# Patient Record
Sex: Female | Born: 1980 | Race: Black or African American | Hispanic: No | Marital: Single | Smoking: Never smoker
Health system: Southern US, Community
[De-identification: ages and names within clinical notes are randomized; demographics above are authoritative.]

## PROBLEM LIST (undated history)

## (undated) DIAGNOSIS — K219 Gastro-esophageal reflux disease without esophagitis: Secondary | ICD-10-CM

## (undated) DIAGNOSIS — R011 Cardiac murmur, unspecified: Secondary | ICD-10-CM

## (undated) DIAGNOSIS — N809 Endometriosis, unspecified: Secondary | ICD-10-CM

## (undated) DIAGNOSIS — J189 Pneumonia, unspecified organism: Secondary | ICD-10-CM

## (undated) DIAGNOSIS — R519 Headache, unspecified: Secondary | ICD-10-CM

## (undated) DIAGNOSIS — Z789 Other specified health status: Secondary | ICD-10-CM

## (undated) DIAGNOSIS — F419 Anxiety disorder, unspecified: Secondary | ICD-10-CM

## (undated) DIAGNOSIS — D649 Anemia, unspecified: Secondary | ICD-10-CM

## (undated) DIAGNOSIS — J40 Bronchitis, not specified as acute or chronic: Secondary | ICD-10-CM

## (undated) DIAGNOSIS — IMO0001 Reserved for inherently not codable concepts without codable children: Secondary | ICD-10-CM

## (undated) HISTORY — PX: OOPHORECTOMY: SHX86

---
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
## 2002-03-25 ENCOUNTER — Other Ambulatory Visit: Admission: RE | Admit: 2002-03-25 | Discharge: 2002-03-25 | Payer: Self-pay | Admitting: Obstetrics and Gynecology

## 2002-05-04 ENCOUNTER — Ambulatory Visit (HOSPITAL_COMMUNITY): Admission: RE | Admit: 2002-05-04 | Discharge: 2002-05-04 | Payer: Self-pay | Admitting: Obstetrics and Gynecology

## 2002-05-04 ENCOUNTER — Inpatient Hospital Stay (HOSPITAL_COMMUNITY): Admission: AD | Admit: 2002-05-04 | Discharge: 2002-05-04 | Payer: Self-pay | Admitting: *Deleted

## 2002-05-21 ENCOUNTER — Inpatient Hospital Stay (HOSPITAL_COMMUNITY): Admission: AD | Admit: 2002-05-21 | Discharge: 2002-05-21 | Payer: Self-pay | Admitting: Obstetrics and Gynecology

## 2002-05-21 ENCOUNTER — Encounter: Payer: Self-pay | Admitting: Obstetrics and Gynecology

## 2002-05-23 ENCOUNTER — Inpatient Hospital Stay (HOSPITAL_COMMUNITY): Admission: AD | Admit: 2002-05-23 | Discharge: 2002-05-26 | Payer: Self-pay | Admitting: Obstetrics and Gynecology

## 2002-09-23 ENCOUNTER — Emergency Department (HOSPITAL_COMMUNITY): Admission: EM | Admit: 2002-09-23 | Discharge: 2002-09-23 | Payer: Self-pay | Admitting: Emergency Medicine

## 2002-09-23 ENCOUNTER — Encounter: Payer: Self-pay | Admitting: Emergency Medicine

## 2002-10-01 ENCOUNTER — Emergency Department (HOSPITAL_COMMUNITY): Admission: EM | Admit: 2002-10-01 | Discharge: 2002-10-01 | Payer: Self-pay | Admitting: Emergency Medicine

## 2002-11-04 HISTORY — PX: CERVIX LESION DESTRUCTION: SHX591

## 2003-04-06 ENCOUNTER — Other Ambulatory Visit: Admission: RE | Admit: 2003-04-06 | Discharge: 2003-04-06 | Payer: Self-pay | Admitting: Obstetrics and Gynecology

## 2003-06-02 ENCOUNTER — Inpatient Hospital Stay (HOSPITAL_COMMUNITY): Admission: AD | Admit: 2003-06-02 | Discharge: 2003-06-02 | Payer: Self-pay | Admitting: Obstetrics and Gynecology

## 2003-07-08 ENCOUNTER — Inpatient Hospital Stay (HOSPITAL_COMMUNITY): Admission: AD | Admit: 2003-07-08 | Discharge: 2003-07-08 | Payer: Self-pay | Admitting: Obstetrics and Gynecology

## 2003-08-10 ENCOUNTER — Encounter: Admission: RE | Admit: 2003-08-10 | Discharge: 2003-09-07 | Payer: Self-pay | Admitting: Family Medicine

## 2003-09-28 ENCOUNTER — Other Ambulatory Visit: Admission: RE | Admit: 2003-09-28 | Discharge: 2003-09-28 | Payer: Self-pay | Admitting: Obstetrics and Gynecology

## 2004-02-08 ENCOUNTER — Inpatient Hospital Stay (HOSPITAL_COMMUNITY): Admission: AD | Admit: 2004-02-08 | Discharge: 2004-02-08 | Payer: Self-pay | Admitting: Obstetrics and Gynecology

## 2004-06-15 ENCOUNTER — Inpatient Hospital Stay (HOSPITAL_COMMUNITY): Admission: AD | Admit: 2004-06-15 | Discharge: 2004-06-15 | Payer: Self-pay | Admitting: Obstetrics and Gynecology

## 2004-10-01 ENCOUNTER — Emergency Department (HOSPITAL_COMMUNITY): Admission: EM | Admit: 2004-10-01 | Discharge: 2004-10-01 | Payer: Self-pay | Admitting: Emergency Medicine

## 2004-10-08 ENCOUNTER — Other Ambulatory Visit: Admission: RE | Admit: 2004-10-08 | Discharge: 2004-10-08 | Payer: Self-pay | Admitting: Obstetrics and Gynecology

## 2005-07-26 ENCOUNTER — Inpatient Hospital Stay (HOSPITAL_COMMUNITY): Admission: AD | Admit: 2005-07-26 | Discharge: 2005-07-27 | Payer: Self-pay | Admitting: Obstetrics and Gynecology

## 2005-07-27 ENCOUNTER — Inpatient Hospital Stay (HOSPITAL_COMMUNITY): Admission: AD | Admit: 2005-07-27 | Discharge: 2005-07-27 | Payer: Self-pay | Admitting: Obstetrics and Gynecology

## 2005-12-03 ENCOUNTER — Emergency Department (HOSPITAL_COMMUNITY): Admission: EM | Admit: 2005-12-03 | Discharge: 2005-12-03 | Payer: Self-pay | Admitting: Emergency Medicine

## 2006-02-18 ENCOUNTER — Other Ambulatory Visit: Admission: RE | Admit: 2006-02-18 | Discharge: 2006-02-18 | Payer: Self-pay | Admitting: Obstetrics and Gynecology

## 2006-07-07 ENCOUNTER — Inpatient Hospital Stay (HOSPITAL_COMMUNITY): Admission: AD | Admit: 2006-07-07 | Discharge: 2006-07-07 | Payer: Self-pay | Admitting: Obstetrics and Gynecology

## 2006-09-05 ENCOUNTER — Inpatient Hospital Stay (HOSPITAL_COMMUNITY): Admission: AD | Admit: 2006-09-05 | Discharge: 2006-09-05 | Payer: Self-pay | Admitting: Obstetrics and Gynecology

## 2006-10-01 ENCOUNTER — Emergency Department (HOSPITAL_COMMUNITY): Admission: EM | Admit: 2006-10-01 | Discharge: 2006-10-01 | Payer: Self-pay | Admitting: Emergency Medicine

## 2006-10-03 ENCOUNTER — Inpatient Hospital Stay (HOSPITAL_COMMUNITY): Admission: AD | Admit: 2006-10-03 | Discharge: 2006-10-03 | Payer: Self-pay | Admitting: Obstetrics & Gynecology

## 2006-10-08 ENCOUNTER — Emergency Department (HOSPITAL_COMMUNITY): Admission: EM | Admit: 2006-10-08 | Discharge: 2006-10-08 | Payer: Self-pay | Admitting: Emergency Medicine

## 2006-11-22 ENCOUNTER — Inpatient Hospital Stay (HOSPITAL_COMMUNITY): Admission: AD | Admit: 2006-11-22 | Discharge: 2006-11-22 | Payer: Self-pay | Admitting: Obstetrics and Gynecology

## 2006-12-15 ENCOUNTER — Inpatient Hospital Stay (HOSPITAL_COMMUNITY): Admission: AD | Admit: 2006-12-15 | Discharge: 2006-12-15 | Payer: Self-pay | Admitting: Obstetrics and Gynecology

## 2007-01-27 ENCOUNTER — Inpatient Hospital Stay (HOSPITAL_COMMUNITY): Admission: AD | Admit: 2007-01-27 | Discharge: 2007-01-31 | Payer: Self-pay | Admitting: Obstetrics & Gynecology

## 2007-04-02 ENCOUNTER — Inpatient Hospital Stay (HOSPITAL_COMMUNITY): Admission: AD | Admit: 2007-04-02 | Discharge: 2007-04-02 | Payer: Self-pay | Admitting: Obstetrics and Gynecology

## 2007-11-06 ENCOUNTER — Inpatient Hospital Stay (HOSPITAL_COMMUNITY): Admission: AD | Admit: 2007-11-06 | Discharge: 2007-11-06 | Payer: Self-pay | Admitting: Obstetrics and Gynecology

## 2008-01-30 ENCOUNTER — Emergency Department (HOSPITAL_COMMUNITY): Admission: EM | Admit: 2008-01-30 | Discharge: 2008-01-31 | Payer: Self-pay | Admitting: Emergency Medicine

## 2008-11-04 HISTORY — PX: OOPHORECTOMY: SHX86

## 2008-11-04 HISTORY — PX: OTHER SURGICAL HISTORY: SHX169

## 2008-11-20 ENCOUNTER — Inpatient Hospital Stay (HOSPITAL_COMMUNITY): Admission: AD | Admit: 2008-11-20 | Discharge: 2008-11-20 | Payer: Self-pay | Admitting: Obstetrics and Gynecology

## 2009-06-05 ENCOUNTER — Emergency Department (HOSPITAL_BASED_OUTPATIENT_CLINIC_OR_DEPARTMENT_OTHER): Admission: EM | Admit: 2009-06-05 | Discharge: 2009-06-06 | Payer: Self-pay | Admitting: Emergency Medicine

## 2009-06-06 ENCOUNTER — Ambulatory Visit: Payer: Self-pay | Admitting: Diagnostic Radiology

## 2009-07-20 ENCOUNTER — Encounter (INDEPENDENT_AMBULATORY_CARE_PROVIDER_SITE_OTHER): Payer: Self-pay | Admitting: Obstetrics and Gynecology

## 2009-07-20 ENCOUNTER — Ambulatory Visit (HOSPITAL_COMMUNITY): Admission: RE | Admit: 2009-07-20 | Discharge: 2009-07-20 | Payer: Self-pay | Admitting: Obstetrics and Gynecology

## 2009-10-01 ENCOUNTER — Ambulatory Visit: Payer: Self-pay | Admitting: Obstetrics and Gynecology

## 2009-10-01 ENCOUNTER — Inpatient Hospital Stay (HOSPITAL_COMMUNITY): Admission: AD | Admit: 2009-10-01 | Discharge: 2009-10-01 | Payer: Self-pay | Admitting: Obstetrics and Gynecology

## 2009-11-17 ENCOUNTER — Emergency Department (HOSPITAL_BASED_OUTPATIENT_CLINIC_OR_DEPARTMENT_OTHER): Admission: EM | Admit: 2009-11-17 | Discharge: 2009-11-17 | Payer: Self-pay | Admitting: Emergency Medicine

## 2010-01-10 ENCOUNTER — Emergency Department (HOSPITAL_BASED_OUTPATIENT_CLINIC_OR_DEPARTMENT_OTHER): Admission: EM | Admit: 2010-01-10 | Discharge: 2010-01-10 | Payer: Self-pay | Admitting: Emergency Medicine

## 2010-04-26 ENCOUNTER — Emergency Department (HOSPITAL_BASED_OUTPATIENT_CLINIC_OR_DEPARTMENT_OTHER): Admission: EM | Admit: 2010-04-26 | Discharge: 2010-04-26 | Payer: Self-pay | Admitting: Emergency Medicine

## 2010-05-09 ENCOUNTER — Emergency Department (HOSPITAL_COMMUNITY): Admission: EM | Admit: 2010-05-09 | Discharge: 2010-05-09 | Payer: Self-pay | Admitting: Emergency Medicine

## 2010-06-28 ENCOUNTER — Emergency Department (HOSPITAL_BASED_OUTPATIENT_CLINIC_OR_DEPARTMENT_OTHER): Admission: EM | Admit: 2010-06-28 | Discharge: 2010-06-29 | Payer: Self-pay | Admitting: Emergency Medicine

## 2010-11-25 ENCOUNTER — Encounter: Payer: Self-pay | Admitting: Family Medicine

## 2011-01-18 LAB — URINALYSIS, ROUTINE W REFLEX MICROSCOPIC
Ketones, ur: NEGATIVE mg/dL
Nitrite: NEGATIVE
Protein, ur: NEGATIVE mg/dL
pH: 8.5 — ABNORMAL HIGH (ref 5.0–8.0)

## 2011-01-18 LAB — PREGNANCY, URINE: Preg Test, Ur: NEGATIVE

## 2011-01-20 LAB — URINALYSIS, ROUTINE W REFLEX MICROSCOPIC
Bilirubin Urine: NEGATIVE
Hgb urine dipstick: NEGATIVE
Protein, ur: NEGATIVE mg/dL
Urobilinogen, UA: 0.2 mg/dL (ref 0.0–1.0)

## 2011-01-20 LAB — POCT I-STAT, CHEM 8
BUN: 9 mg/dL (ref 6–23)
Calcium, Ion: 1.12 mmol/L (ref 1.12–1.32)
Creatinine, Ser: 0.8 mg/dL (ref 0.4–1.2)
Glucose, Bld: 90 mg/dL (ref 70–99)
TCO2: 25 mmol/L (ref 0–100)

## 2011-02-06 LAB — WET PREP, GENITAL

## 2011-02-06 LAB — URINALYSIS, ROUTINE W REFLEX MICROSCOPIC
Glucose, UA: NEGATIVE mg/dL
Protein, ur: NEGATIVE mg/dL
Specific Gravity, Urine: 1.015 (ref 1.005–1.030)
pH: 8.5 — ABNORMAL HIGH (ref 5.0–8.0)

## 2011-02-06 LAB — POCT PREGNANCY, URINE: Preg Test, Ur: NEGATIVE

## 2011-02-08 LAB — CBC
HCT: 33.8 % — ABNORMAL LOW (ref 36.0–46.0)
MCV: 96.6 fL (ref 78.0–100.0)
Platelets: 271 10*3/uL (ref 150–400)
WBC: 4.5 10*3/uL (ref 4.0–10.5)

## 2011-02-10 LAB — CBC
HCT: 37.5 % (ref 36.0–46.0)
Hemoglobin: 12.9 g/dL (ref 12.0–15.0)
MCHC: 34.4 g/dL (ref 30.0–36.0)
MCV: 96.4 fL (ref 78.0–100.0)
RBC: 3.89 MIL/uL (ref 3.87–5.11)

## 2011-02-10 LAB — DIFFERENTIAL
Basophils Relative: 1 % (ref 0–1)
Eosinophils Absolute: 0.2 10*3/uL (ref 0.0–0.7)
Monocytes Absolute: 0.4 10*3/uL (ref 0.1–1.0)
Monocytes Relative: 5 % (ref 3–12)

## 2011-02-10 LAB — GC/CHLAMYDIA PROBE AMP, GENITAL
Chlamydia, DNA Probe: NEGATIVE
GC Probe Amp, Genital: NEGATIVE

## 2011-02-10 LAB — URINALYSIS, ROUTINE W REFLEX MICROSCOPIC
Glucose, UA: NEGATIVE mg/dL
Hgb urine dipstick: NEGATIVE
Protein, ur: NEGATIVE mg/dL
Specific Gravity, Urine: 1.023 (ref 1.005–1.030)

## 2011-02-10 LAB — BASIC METABOLIC PANEL
CO2: 29 mEq/L (ref 19–32)
Chloride: 103 mEq/L (ref 96–112)
GFR calc Af Amer: >60 mL/min (ref >60)
Potassium: 3.6 mEq/L (ref 3.5–5.1)
Sodium: 143 mEq/L (ref 135–145)

## 2011-02-18 LAB — DIFFERENTIAL
Basophils Absolute: 0 10*3/uL (ref 0.0–0.1)
Eosinophils Relative: 0 % (ref 0–5)
Lymphocytes Relative: 4 % — ABNORMAL LOW (ref 12–46)
Monocytes Absolute: 0.2 10*3/uL (ref 0.1–1.0)

## 2011-02-18 LAB — CBC
MCV: 97.1 fL (ref 78.0–100.0)
Platelets: 276 10*3/uL (ref 150–400)
RDW: 11.3 % — ABNORMAL LOW (ref 11.5–15.5)
WBC: 12.8 10*3/uL — ABNORMAL HIGH (ref 4.0–10.5)

## 2011-02-18 LAB — URINALYSIS, ROUTINE W REFLEX MICROSCOPIC
Bilirubin Urine: NEGATIVE
Nitrite: NEGATIVE
Protein, ur: NEGATIVE mg/dL
Specific Gravity, Urine: 1.015 (ref 1.005–1.030)
Urobilinogen, UA: 0.2 mg/dL (ref 0.0–1.0)

## 2011-02-18 LAB — WET PREP, GENITAL: Trich, Wet Prep: NONE SEEN

## 2011-02-18 LAB — POCT PREGNANCY, URINE: Preg Test, Ur: NEGATIVE

## 2011-03-22 NOTE — Op Note (Signed)
[facility]  Patient:    Monique Hughes, Monique Hughes Visit Number: [id number] MRN: [id number]          Service Type: GYN Location: [facility] Attending Physician:  Donne Hazel Dictated by:   Marcelle Overlie, M.D. Proc. Date: 05/04/02 Admit Date:  05/04/2002 Discharge Date: 05/04/2002                             Operative Report  PREOPERATIVE DIAGNOSIS:       Pelvic pain.  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  POSTOPERATIVE DIAGNOSIS:      Endometriosis.  OPERATION:                    Laparoscopy and fulguration of endometriosis.  SURGEON:                      Marcelle Overlie, M.D.  ASSISTANT:  ANESTHESIA:                   General.  ESTIMATED BLOOD LOSS:         Less than 50 cc.  DESCRIPTION OF PROCEDURE:     The patient was taken to the operating room after she was intubated.  She was then placed in the lithotomy position. The abdomen, vagina, and vulva were prepped and draped in the usual sterile fashion.  An in-and-out catheter was used to empty the bladder.  The uterine manipulator was inserted into the uterus.  Attention was then turned to the abdomen where a small incision was made at the umbilicus and the Veress needle was inserted into the peritoneal cavity.  A pneumoperitoneum was then achieved. The Veress needle was removed and a 10 mm trocar was inserted without difficulty.  The patient was placed in Trendelenburg position.  The laparoscope was introduced into the peritoneal cavity.  There was no bleeding noted.  Two accessory trocars were inserted.  A 5 mm suprapubic and a 5 mm left lower quadrant.  These were both inserted under direct visualization. The upper abdomen was inspected and noted to be normal.  The pelvis was inspected.  The uterus appeared to be normal.  The left fallopian tube and left ovary appeared normal.  The pelvis, however, revealed powderburn lesions in the cul-de-sac and one thin filmy adhesion which was  consistent with endometriosis.  A careful inspection of the right ovary, the surface appeared very smooth.  There was no evidence on the exterior surface of a dermoid, however, a longitudinal incision was made along the entire course of the ovary and the ovary was explored.  There was nothing inside that appeared consistent with a dermoid tumor.  The ovary was hemostatic with Kleppingers and the endometriosis in the pelvis was fulgurated using Kleppingers.  All of the serous fluid in the cul-de-sac was also irrigated as well.  All areas were noted to be hemostatic.  The pneumoperitoneum was released.  All instruments were removed from the abdominal cavity and incisions were closed using Dermabond.  All sponge, needle, and instrument counts were correct x2.  The patient tolerated the procedure well and went to the recovery room in stable condition. Dictated by:   Marcelle Overlie, M.D. Attending Physician:  Donne Hazel DD:  05/04/02 TD:  05/06/02 Job: [id number]

## 2011-03-22 NOTE — Discharge Summary (Signed)
   NAME:  Monique Hughes, Monique Hughes                         ACCOUNT NO.:  [id number]   MEDICAL RECORD NO.:  [id number]                   PATIENT TYPE:  MAT   LOCATION:  [facility]                                 FACILITY:  [facility]   PHYSICIAN:  Michelle L. Vincente Poli, M.D.            DATE OF BIRTH:  01/29/1981   DATE OF ADMISSION:  05/22/2002  DATE OF DISCHARGE:  05/26/2002                                 DISCHARGE SUMMARY   ADMISSION DIAGNOSES:  Endometritis.   DISCHARGE DIAGNOSES:  Endometritis.   HOSPITAL COURSE:  An 30 year old gravida 0 who recently underwent a  laparoscopy for treatment of endometriosis.  Presents complaining of lower  abdominal pain, fever, nausea, and vomiting.  On admission she was noted to  be afebrile; however, had an elevated white count of 18.6 with a left shift.  She had some right lower quadrant tenderness.  Pelvic ultrasound was  performed which was unremarkable.  The patient was placed on antibiotics and  observed.  Her white blood cell count decreased and was within normal range  of 8.9.  Her hemoglobin stayed stable 10.8.  The patient was discharged home  on May 26, 2002 without pain.  She had remained afebrile with stable vital  signs throughout her hospitalization.  She was discharged home with  Augmentin 250 mg p.o. b.i.d. for 14 days.  She was advised to call if she  has a temperature greater than 100.5, nausea, vomiting, severe abdominal  pain.  She will follow up in the office in two weeks.                                               Michelle L. Vincente Poli, M.D.    Florestine Avers  D:  06/16/2002  T:  06/17/2002  Job:  [id number]

## 2011-06-01 ENCOUNTER — Encounter: Payer: Self-pay | Admitting: Emergency Medicine

## 2011-06-01 ENCOUNTER — Emergency Department (HOSPITAL_BASED_OUTPATIENT_CLINIC_OR_DEPARTMENT_OTHER)
Admission: EM | Admit: 2011-06-01 | Discharge: 2011-06-01 | Disposition: A | Discharge status: Home / Self Care | Payer: Self-pay | Attending: Emergency Medicine | Admitting: Emergency Medicine

## 2011-06-01 DIAGNOSIS — IMO0001 Reserved for inherently not codable concepts without codable children: Secondary | ICD-10-CM | POA: Insufficient documentation

## 2011-06-01 DIAGNOSIS — S40861A Insect bite (nonvenomous) of right upper arm, initial encounter: Secondary | ICD-10-CM

## 2011-06-01 DIAGNOSIS — J45909 Unspecified asthma, uncomplicated: Secondary | ICD-10-CM | POA: Insufficient documentation

## 2011-06-01 MED ORDER — DIPHENHYDRAMINE HCL 50 MG/ML IJ SOLN
25.0000 mg | Freq: Once | INTRAMUSCULAR | Status: AC
  Administered 2011-06-01: 25 mg via INTRAMUSCULAR
  Filled 2011-06-01: qty 1

## 2011-06-01 NOTE — ED Notes (Signed)
Pt states she was stung by a bee or wasp earlier today.  Noted redness at right ac area.  No resp distress.  Pt c/o pain at site.

## 2011-06-01 NOTE — ED Provider Notes (Addendum)
History     Chief Complaint  Patient presents with  . Insect Bite   HPI Comments: Pt was stung by a bee and states that she has redness and she state that she has a previous history of her foot swelling from a similar sting  Patient is a 30 y.o. female presenting with animal bite. The history is provided by the patient. No language interpreter was used.  Animal Bite  The incident occurred just prior to arrival. The incident occurred at home. She came to the ER via personal transport. There is an injury to the right upper arm. The pain is mild. Pertinent negatives include no chest pain, no numbness, no visual disturbance, no nausea, no vomiting, no hearing loss, no cough and no difficulty breathing. There have been no prior injuries to these areas. There were no sick contacts.    Past Medical History  Diagnosis Date  . Asthma     Past Surgical History  Procedure Date  . Oophorectomy     History reviewed. No pertinent family history.  History  Substance Use Topics  . Smoking status: Never Smoker   . Smokeless tobacco: Not on file  . Alcohol Use: No    OB History    Grav Para Term Preterm Abortions TAB SAB Ect Mult Living                  Review of Systems  HENT: Negative for hearing loss.   Eyes: Negative for visual disturbance.  Respiratory: Negative for cough.   Cardiovascular: Negative for chest pain.  Gastrointestinal: Negative for nausea and vomiting.  Neurological: Negative for numbness.  All other systems reviewed and are negative.    Physical Exam  BP 111/76  Pulse 98  Temp(Src) 98.5 F (36.9 C) (Oral)  Resp 16  SpO2 100%  LMP 05/02/2011  Physical Exam  Nursing note and vitals reviewed. Constitutional: She appears well-developed and well-nourished.  Neck: Normal range of motion. Neck supple.  Cardiovascular: Normal rate and regular rhythm.   Pulmonary/Chest: Effort normal and breath sounds normal.  Musculoskeletal: Normal range of motion.    Neurological: She is alert.  Skin:       ED Course  Procedures  MDM No swelling noted:pt not in any distress:pt given benadryl and requesting to leave   Medical screening examination/treatment/procedure(s) were performed by non-physician practitioner and as supervising physician I was immediately available for consultation/collaboration.   Teressa Lower, NP 06/01/11 1555  Teressa Lower, NP 06/01/11 1555  Juliet Rude. Rubin Payor, MD 06/01/11 [phone]

## 2011-06-03 ENCOUNTER — Emergency Department (HOSPITAL_BASED_OUTPATIENT_CLINIC_OR_DEPARTMENT_OTHER)
Admission: EM | Admit: 2011-06-03 | Discharge: 2011-06-03 | Disposition: A | Discharge status: Home / Self Care | Payer: Self-pay | Attending: Emergency Medicine | Admitting: Emergency Medicine

## 2011-06-03 ENCOUNTER — Encounter (HOSPITAL_BASED_OUTPATIENT_CLINIC_OR_DEPARTMENT_OTHER): Payer: Self-pay | Admitting: *Deleted

## 2011-06-03 DIAGNOSIS — L0390 Cellulitis, unspecified: Secondary | ICD-10-CM

## 2011-06-03 DIAGNOSIS — T63461A Toxic effect of venom of wasps, accidental (unintentional), initial encounter: Secondary | ICD-10-CM | POA: Insufficient documentation

## 2011-06-03 DIAGNOSIS — IMO0002 Reserved for concepts with insufficient information to code with codable children: Secondary | ICD-10-CM | POA: Insufficient documentation

## 2011-06-03 DIAGNOSIS — T6391XA Toxic effect of contact with unspecified venomous animal, accidental (unintentional), initial encounter: Secondary | ICD-10-CM | POA: Insufficient documentation

## 2011-06-03 MED ORDER — SULFAMETHOXAZOLE-TMP DS 800-160 MG PO TABS
1.0000 | ORAL_TABLET | Freq: Once | ORAL | Status: AC
  Administered 2011-06-03: 1 via ORAL
  Filled 2011-06-03: qty 1

## 2011-06-03 MED ORDER — SULFAMETHOXAZOLE-TRIMETHOPRIM 800-160 MG PO TABS: 1.0000 | ORAL_TABLET | Freq: Two times a day (BID) | ORAL | Status: AC

## 2011-06-03 NOTE — ED Notes (Signed)
Pt seen here x 3 days ago for bee sting, return today for same, increased swelling redness.

## 2011-06-03 NOTE — ED Provider Notes (Addendum)
History     Chief Complaint  Patient presents with  . Insect Bite   Patient is a 30 y.o. female presenting with arm injury. The history is provided by the patient.  Arm Injury  The incident occurred more than 2 days ago. The incident occurred in the street. There is an injury to the right upper arm. The pain is moderate. It is unlikely that a foreign body is present. There have been prior injuries to these areas. Her tetanus status is UTD. There were no sick contacts. She has received no recent medical care.  Pt was stung by a bee 3 days ago.  Pt reports she has been taking benadryl.  Pt reports area has been increasing in redness and swelling.  Past Medical History  Diagnosis Date  . Asthma     Past Surgical History  Procedure Date  . Oophorectomy     History reviewed. No pertinent family history.  History  Substance Use Topics  . Smoking status: Never Smoker   . Smokeless tobacco: Not on file  . Alcohol Use: No    OB History    Grav Para Term Preterm Abortions TAB SAB Ect Mult Living                  Review of Systems  Skin: Positive for color change.  All other systems reviewed and are negative.    Physical Exam  BP 108/65  Pulse 88  Temp(Src) 98.8 F (37.1 C) (Oral)  Resp 16  Wt 130 lb (58.968 kg)  SpO2 100%  LMP 05/02/2011  Physical Exam  Constitutional: She appears well-developed and well-nourished.  HENT:  Head: Normocephalic.  Musculoskeletal: She exhibits edema and tenderness.  Neurological: She is alert.  Skin: There is erythema.  Psychiatric: She has a normal mood and affect.  12 cm red swollen area right inner arm,  Warm to touch,  From nv and ns intact  ED Course  Procedures  MDM Pt feels sick,  I will treat with bactrim pt advised to continue benadryl every 4hours.  Medical screening examination/treatment/procedure(s) were performed by non-physician practitioner and as supervising physician I was immediately available for  consultation/collaboration. Osvaldo Human, M.D.    Langston Masker, [place] 06/03/11 2247  Langston Masker, [place] 06/03/11 2248  Carleene Cooper III, MD 06/04/11 1121  Carleene Cooper III, MD 07/12/11 [phone]

## 2011-06-03 NOTE — Discharge Instructions (Signed)
Skin Infections A skin infection usually develop as a result of disruption of the skin barrier.  CAUSES A skin infection might occur following:  Trauma or an injury to the skin such as a cut or insect sting.   Inflammation (eczema).   Breaks in the skin between the toes (athletes foot).   Edema (swelling).  SYMPTOMS The legs are the most common site affected. Usually there is:  Redness.   Swelling.   Pain.   Sometimes red streaks in the area of the infection.  TREATMENT Minor skin infections may be treated with topical antibiotics, but if the skin infection is severe, hospital care and intravenous (IV) antibiotic treatment may be needed. Most often skin infections can be treated with oral antibiotic medicine as well as proper rest and elevation of the affected area until the infection improves. If you are prescribed oral antibiotics, it is important to take them as directed and to take all the pills regardless of improvement in your symptoms before you have taken all of the medicine. You may apply warm compresses to the area for 20-30 minutes 4 times daily.  SEEK MEDICAL CARE IF:  The pain and swelling from your infection do not improve within 2 days.  SEEK IMMEDIATE MEDICAL CARE IF:  You develop a fever, chills, or other serious problems.  YOU MIGHT NEED A TETANUS SHOT NOW IF:  You have no idea when you had the last one.   You have never had a tetanus shot before.   Your wound had dirt in it.   If you need a tetanus shot, and you choose not to get one, there is a rare chance of getting tetanus. Sickness from tetanus can be serious.  Document Released: 11/28/2004 Document Re-Released: 01/17/2009 [facility] Patient Information 2011 [place], [place].

## 2011-07-24 ENCOUNTER — Encounter (HOSPITAL_BASED_OUTPATIENT_CLINIC_OR_DEPARTMENT_OTHER): Payer: Self-pay | Admitting: *Deleted

## 2011-07-24 ENCOUNTER — Emergency Department (HOSPITAL_BASED_OUTPATIENT_CLINIC_OR_DEPARTMENT_OTHER)
Admission: EM | Admit: 2011-07-24 | Discharge: 2011-07-24 | Disposition: A | Discharge status: Home / Self Care | Payer: Self-pay | Attending: Emergency Medicine | Admitting: Emergency Medicine

## 2011-07-24 ENCOUNTER — Emergency Department (INDEPENDENT_AMBULATORY_CARE_PROVIDER_SITE_OTHER): Payer: Self-pay

## 2011-07-24 DIAGNOSIS — R0602 Shortness of breath: Secondary | ICD-10-CM | POA: Insufficient documentation

## 2011-07-24 DIAGNOSIS — Z79899 Other long term (current) drug therapy: Secondary | ICD-10-CM | POA: Insufficient documentation

## 2011-07-24 DIAGNOSIS — J45909 Unspecified asthma, uncomplicated: Secondary | ICD-10-CM | POA: Insufficient documentation

## 2011-07-24 DIAGNOSIS — R062 Wheezing: Secondary | ICD-10-CM

## 2011-07-24 LAB — URINALYSIS, ROUTINE W REFLEX MICROSCOPIC
Glucose, UA: NEGATIVE
Nitrite: NEGATIVE
Protein, ur: NEGATIVE
Urobilinogen, UA: 0.2

## 2011-07-24 LAB — COMPREHENSIVE METABOLIC PANEL
ALT: 53 — ABNORMAL HIGH
AST: 24
Calcium: 9.2
Creatinine, Ser: 0.66
GFR calc Af Amer: >60
Sodium: 138
Total Protein: 6.6

## 2011-07-24 LAB — DIFFERENTIAL
Eosinophils Absolute: 0
Eosinophils Relative: 0
Lymphocytes Relative: 24
Lymphs Abs: 1.7
Monocytes Relative: 4

## 2011-07-24 LAB — CBC
MCHC: 34.8
Platelets: 322
RBC: 3.57 — ABNORMAL LOW
RDW: 12

## 2011-07-24 LAB — POCT PREGNANCY, URINE: Preg Test, Ur: NEGATIVE

## 2011-07-24 MED ORDER — ALBUTEROL SULFATE (5 MG/ML) 0.5% IN NEBU
5.0000 mg | INHALATION_SOLUTION | Freq: Once | RESPIRATORY_TRACT | Status: AC
  Administered 2011-07-24: 5 mg via RESPIRATORY_TRACT
  Filled 2011-07-24: qty 1

## 2011-07-24 MED ORDER — PREDNISONE 10 MG PO TABS: 40.0000 mg | ORAL_TABLET | Freq: Every day | ORAL | Status: AC

## 2011-07-24 NOTE — ED Provider Notes (Signed)
History     CSN: [id number] Arrival date & time: 07/24/2011 12:56 PM   Chief Complaint  Patient presents with  . Shortness of Breath  . Asthma     (Include location/radiation/quality/duration/timing/severity/associated sxs/prior treatment) Patient is a 30 y.o. female presenting with shortness of breath and asthma. The history is provided by the patient. No language interpreter was used.  Shortness of Breath  The current episode started yesterday. The onset was sudden. The problem has been unchanged. The problem is mild. The symptoms are relieved by nothing. Associated symptoms include cough and shortness of breath. Pertinent negatives include no fever. Her past medical history is significant for asthma.  Asthma Associated symptoms include coughing. Pertinent negatives include no fever.     Past Medical History  Diagnosis Date  . Asthma      Past Surgical History  Procedure Date  . Oophorectomy     History reviewed. No pertinent family history.  History  Substance Use Topics  . Smoking status: Never Smoker   . Smokeless tobacco: Not on file  . Alcohol Use: No    OB History    Grav Para Term Preterm Abortions TAB SAB Ect Mult Living                  Review of Systems  Constitutional: Negative for fever.  Respiratory: Positive for cough and shortness of breath.   All other systems reviewed and are negative.    Allergies  Chocolate; Citric acid; Grapefruit extract; Lemon juice; Orange fruit; and Tomato  Home Medications   Current Outpatient Rx  Name Route Sig Dispense Refill  . ACETAMINOPHEN 500 MG PO TABS Oral Take 1,000 mg by mouth as needed. For headache    . DIPHENHYDRAMINE HCL 25 MG PO TABS Oral Take 25 mg by mouth as needed. Allergic reaction    . FLUCONAZOLE 150 MG PO TABS Oral Take 150 mg by mouth once.      . MOMETASONE FURO-FORMOTEROL FUM 100-5 MCG/ACT IN AERO Inhalation Inhale 2 puffs into the lungs 2 (two) times daily as needed. For wheezing,  shortness of breath, or coughing     . MULTI-VITAMIN/MINERALS PO TABS Oral Take 1 tablet by mouth daily.      . LO LOESTRIN FE PO Oral Take 1 tablet by mouth daily.      Marland Kitchen NORGESTREL-ETHINYL ESTRADIOL 0.3-30 MG-MCG PO TABS Oral Take 1 tablet by mouth daily.      Marland Kitchen FLORA-Q PO Oral Take 1 tablet by mouth daily.      Marland Kitchen PROMETHAZINE HCL 25 MG PO TABS Oral Take 12.5 mg by mouth as needed. Nausea and vomiting     . VISINE OP Both Eyes Place 2 drops into both eyes daily as needed. For itchy eyes       Physical Exam    BP 106/74  Pulse 101  Temp(Src) 99.2 F (37.3 C) (Oral)  Resp 21  Wt 130 lb (58.968 kg)  SpO2 100%  LMP 07/04/2011  Physical Exam  Nursing note and vitals reviewed. Constitutional: She is oriented to person, place, and time. She appears well-developed.  HENT:  Head: Atraumatic.  Right Ear: External ear normal.  Left Ear: External ear normal.  Mouth/Throat: Oropharynx is clear and moist.  Eyes: Pupils are equal, round, and reactive to light.  Cardiovascular: Normal rate and regular rhythm.   Pulmonary/Chest: Effort normal and breath sounds normal.  Musculoskeletal: Normal range of motion.  Neurological: She is alert and oriented to person, place, and  time.  Skin: Skin is warm and dry.    ED Course  Procedures   07/24/2011  *RADIOLOGY REPORT*  Clinical Data: Wheezing and short of breath  CHEST - 2 VIEW  Comparison: 05/09/2010  Findings: Normal heart size.  Lungs are hyperaerated and clear. Mild bronchitic changes.  No pneumothorax or pleural fluid.  IMPRESSION: No active cardiopulmonary disease.  Original Report Authenticated By: Donavan Burnet, M.D.     No diagnosis found.   MDM Pt feeling better after treatment:will put on steroids for bronchitis       Teressa Lower, NP 07/24/11 1433

## 2011-07-24 NOTE — ED Notes (Signed)
Pt c/o hx asthma, non pro cough, SOb x 1 week

## 2011-07-28 NOTE — ED Provider Notes (Signed)
Medical screening examination/treatment/procedure(s) were conducted as a shared visit with non-physician practitioner(s) and myself.  I personally evaluated the patient during the encounter  Toy Baker, MD 07/28/11 1547

## 2012-05-24 ENCOUNTER — Emergency Department (HOSPITAL_BASED_OUTPATIENT_CLINIC_OR_DEPARTMENT_OTHER)
Admission: EM | Admit: 2012-05-24 | Discharge: 2012-05-24 | Disposition: A | Discharge status: Home / Self Care | Payer: No Typology Code available for payment source | Attending: Emergency Medicine | Admitting: Emergency Medicine

## 2012-05-24 ENCOUNTER — Encounter (HOSPITAL_BASED_OUTPATIENT_CLINIC_OR_DEPARTMENT_OTHER): Payer: Self-pay | Admitting: *Deleted

## 2012-05-24 ENCOUNTER — Emergency Department (HOSPITAL_BASED_OUTPATIENT_CLINIC_OR_DEPARTMENT_OTHER): Payer: No Typology Code available for payment source

## 2012-05-24 DIAGNOSIS — S43409A Unspecified sprain of unspecified shoulder joint, initial encounter: Secondary | ICD-10-CM

## 2012-05-24 DIAGNOSIS — Y9301 Activity, walking, marching and hiking: Secondary | ICD-10-CM | POA: Insufficient documentation

## 2012-05-24 DIAGNOSIS — K219 Gastro-esophageal reflux disease without esophagitis: Secondary | ICD-10-CM | POA: Insufficient documentation

## 2012-05-24 DIAGNOSIS — IMO0002 Reserved for concepts with insufficient information to code with codable children: Secondary | ICD-10-CM | POA: Insufficient documentation

## 2012-05-24 DIAGNOSIS — Y998 Other external cause status: Secondary | ICD-10-CM | POA: Insufficient documentation

## 2012-05-24 DIAGNOSIS — W010XXA Fall on same level from slipping, tripping and stumbling without subsequent striking against object, initial encounter: Secondary | ICD-10-CM | POA: Insufficient documentation

## 2012-05-24 HISTORY — DX: Gastro-esophageal reflux disease without esophagitis: K21.9

## 2012-05-24 HISTORY — DX: Reserved for inherently not codable concepts without codable children: IMO0001

## 2012-05-24 MED ORDER — HYDROCODONE-ACETAMINOPHEN 5-500 MG PO TABS: 1.0000 | ORAL_TABLET | Freq: Four times a day (QID) | ORAL | Status: AC | PRN

## 2012-05-24 MED ORDER — CYCLOBENZAPRINE HCL 10 MG PO TABS: 10.0000 mg | ORAL_TABLET | Freq: Two times a day (BID) | ORAL | Status: AC | PRN

## 2012-05-24 MED ORDER — KETOROLAC TROMETHAMINE 60 MG/2ML IM SOLN
60.0000 mg | Freq: Once | INTRAMUSCULAR | Status: AC
  Administered 2012-05-24: 60 mg via INTRAMUSCULAR
  Filled 2012-05-24: qty 2

## 2012-05-24 NOTE — ED Notes (Signed)
Patient states she slipped on water at [facility] yesterday around [phone].  Landed on her right side and shoulder.  C/O pain right shoulder, upper back and right foot pain. Denies loc.

## 2012-05-24 NOTE — ED Provider Notes (Addendum)
History     CSN: [id number]  Arrival date & time 05/24/12  1004   First MD Initiated Contact with Patient 05/24/12 1028      Chief Complaint  Patient presents with  . Fall  . Shoulder Injury    (Consider location/radiation/quality/duration/timing/severity/associated sxs/prior treatment) Patient is a 31 y.o. female presenting with fall. The history is provided by the patient.  Fall The accident occurred yesterday. The fall occurred while walking (Slipped in water and fell on a concrete floor). She fell from a height of 1 to 2 ft. She landed on concrete. There was no blood loss. The point of impact was the right shoulder. The pain is present in the right shoulder. The pain is at a severity of 9/10. The pain is severe. She was ambulatory at the scene. Pertinent negatives include no numbness, no abdominal pain, no bowel incontinence, no headaches, no loss of consciousness and no tingling. The symptoms are aggravated by activity, use of the injured limb, pressure on the injury and rotation. She has tried NSAIDs and acetaminophen for the symptoms. The treatment provided mild relief.    Past Medical History  Diagnosis Date  . Asthma   . Reflux     Past Surgical History  Procedure Date  . Oophorectomy   . Laproscopy 2010    cyst removal from ovary    No family history on file.  History  Substance Use Topics  . Smoking status: Never Smoker   . Smokeless tobacco: Not on file  . Alcohol Use: No    OB History    Grav Para Term Preterm Abortions TAB SAB Ect Mult Living                  Review of Systems  Gastrointestinal: Negative for abdominal pain and bowel incontinence.  Neurological: Negative for tingling, loss of consciousness, numbness and headaches.  All other systems reviewed and are negative.    Allergies  Chocolate; Citric acid; Grapefruit extract; Lemon juice; Orange fruit; and Tomato  Home Medications   Current Outpatient Rx  Name Route Sig Dispense Refill   . ACETAMINOPHEN 500 MG PO TABS Oral Take 1,000 mg by mouth as needed. For headache    . DIPHENHYDRAMINE HCL 25 MG PO TABS Oral Take 25 mg by mouth as needed. Allergic reaction    . FLUCONAZOLE 150 MG PO TABS Oral Take 150 mg by mouth once.      . MOMETASONE FURO-FORMOTEROL FUM 100-5 MCG/ACT IN AERO Inhalation Inhale 2 puffs into the lungs 2 (two) times daily as needed. For wheezing, shortness of breath, or coughing     . MULTI-VITAMIN/MINERALS PO TABS Oral Take 1 tablet by mouth daily.      . LO LOESTRIN FE PO Oral Take 1 tablet by mouth daily.      Marland Kitchen NORGESTREL-ETHINYL ESTRADIOL 0.3-30 MG-MCG PO TABS Oral Take 1 tablet by mouth daily.      Marland Kitchen FLORA-Q PO Oral Take 1 tablet by mouth daily.      Marland Kitchen PROMETHAZINE HCL 25 MG PO TABS Oral Take 12.5 mg by mouth as needed. Nausea and vomiting     . VISINE OP Both Eyes Place 2 drops into both eyes daily as needed. For itchy eyes       BP 104/71  Pulse 78  Temp 98.3 F (36.8 C) (Oral)  Resp 20  SpO2 100%  LMP 05/09/2012  Physical Exam  Nursing note and vitals reviewed. Constitutional: She is oriented to person, place,  and time. She appears well-developed and well-nourished. She appears distressed.  HENT:  Head: Normocephalic and atraumatic.  Eyes: EOM are normal. Pupils are equal, round, and reactive to light.  Cardiovascular: Normal rate, regular rhythm, normal heart sounds and intact distal pulses.  Exam reveals no friction rub.   No murmur heard. Pulmonary/Chest: Effort normal and breath sounds normal. She has no wheezes. She has no rales. She exhibits no tenderness.  Abdominal: Soft. Bowel sounds are normal. She exhibits no distension. There is no tenderness. There is no rebound and no guarding.  Musculoskeletal: She exhibits no tenderness.       Right shoulder: She exhibits decreased range of motion, tenderness, bony tenderness, pain and spasm. She exhibits no swelling, no deformity, normal pulse and normal strength.       Right wrist:  Normal.       Right foot: Normal.       No edema  Neurological: She is alert and oriented to person, place, and time. No cranial nerve deficit.  Skin: Skin is warm and dry. No rash noted.  Psychiatric: She has a normal mood and affect. Her behavior is normal.    ED Course  Procedures (including critical care time)  Labs Reviewed - No data to display Dg Shoulder Right  05/24/2012  *RADIOLOGY REPORT*  Clinical Data: Fall, left shoulder pain, decreased range of motion  RIGHT SHOULDER - 2+ VIEW  Comparison: None.  Findings: Normal alignment without fracture.  AC joint aligned. Intact left ribs and scapula.  IMPRESSION: No acute osseous finding  Original Report Authenticated By: Judie Petit. Ruel Favors, M.D.     1. Shoulder sprain       MDM   Patient with a mechanical fall last night resulting in injury to the right shoulder. She states initially her right wrist and right foot are hurting which has resolved. However the shoulder pain persists. She denies any head injury or LOC.  Significant pain over the anterior and posterior shoulder. No clavicular pain. Neurovascularly intact decreased range of motion due to pain. Plain films pending.  11:22 AM Plain films neg.  Pt given pain control and d/ced home.    Gwyneth Sprout, MD 05/24/12 1136  Gwyneth Sprout, MD 05/24/12 1138

## 2012-07-08 ENCOUNTER — Inpatient Hospital Stay (HOSPITAL_COMMUNITY): Payer: Self-pay

## 2012-07-08 ENCOUNTER — Encounter (HOSPITAL_COMMUNITY): Payer: Self-pay | Admitting: *Deleted

## 2012-07-08 ENCOUNTER — Inpatient Hospital Stay (HOSPITAL_COMMUNITY)
Admission: AD | Admit: 2012-07-08 | Discharge: 2012-07-09 | Disposition: A | Discharge status: Home / Self Care | Payer: Self-pay | Source: Ambulatory Visit | Attending: Obstetrics and Gynecology | Admitting: Obstetrics and Gynecology

## 2012-07-08 DIAGNOSIS — Z3490 Encounter for supervision of normal pregnancy, unspecified, unspecified trimester: Secondary | ICD-10-CM

## 2012-07-08 DIAGNOSIS — O34599 Maternal care for other abnormalities of gravid uterus, unspecified trimester: Secondary | ICD-10-CM | POA: Insufficient documentation

## 2012-07-08 DIAGNOSIS — R109 Unspecified abdominal pain: Secondary | ICD-10-CM | POA: Insufficient documentation

## 2012-07-08 DIAGNOSIS — N949 Unspecified condition associated with female genital organs and menstrual cycle: Secondary | ICD-10-CM | POA: Insufficient documentation

## 2012-07-08 DIAGNOSIS — R1032 Left lower quadrant pain: Secondary | ICD-10-CM

## 2012-07-08 DIAGNOSIS — N831 Corpus luteum cyst of ovary, unspecified side: Secondary | ICD-10-CM | POA: Insufficient documentation

## 2012-07-08 HISTORY — DX: Other specified health status: Z78.9

## 2012-07-08 LAB — URINALYSIS, ROUTINE W REFLEX MICROSCOPIC
Glucose, UA: NEGATIVE mg/dL
Hgb urine dipstick: NEGATIVE
Ketones, ur: NEGATIVE mg/dL
Leukocytes, UA: NEGATIVE
Protein, ur: NEGATIVE mg/dL
pH: 8.5 — ABNORMAL HIGH (ref 5.0–8.0)

## 2012-07-08 LAB — CBC WITH DIFFERENTIAL/PLATELET
Basophils Absolute: 0 10*3/uL (ref 0.0–0.1)
Eosinophils Absolute: 0 10*3/uL (ref 0.0–0.7)
Eosinophils Relative: 0 % (ref 0–5)
HCT: 34.2 % — ABNORMAL LOW (ref 36.0–46.0)
Lymphocytes Relative: 18 % (ref 12–46)
MCH: 31.7 pg (ref 26.0–34.0)
MCHC: 34.2 g/dL (ref 30.0–36.0)
MCV: 92.7 fL (ref 78.0–100.0)
Monocytes Absolute: 0.5 10*3/uL (ref 0.1–1.0)
Platelets: 311 10*3/uL (ref 150–400)
RDW: 11.8 % (ref 11.5–15.5)
WBC: 10.1 10*3/uL (ref 4.0–10.5)

## 2012-07-08 LAB — POCT PREGNANCY, URINE: Preg Test, Ur: POSITIVE — AB

## 2012-07-08 LAB — WET PREP, GENITAL: Trich, Wet Prep: NONE SEEN

## 2012-07-08 MED ORDER — KETOROLAC TROMETHAMINE 60 MG/2ML IM SOLN
60.0000 mg | Freq: Once | INTRAMUSCULAR | Status: DC
  Filled 2012-07-08: qty 2

## 2012-07-08 NOTE — MAU Provider Note (Signed)
History     CSN: [id number]  Arrival date and time: 07/08/12 2045   First Provider Initiated Contact with Patient 07/08/12 2209      Chief Complaint  Patient presents with  . Chills  . Abdominal Pain   HPI Monique Hughes is a 31 y.o. female who presents to MAU with abdominal pain. The pain started 4 or 5 days ago. She describes the pain sharp shooting that comes and goes. The pain is located in the left lower abdomen. She rates the pain as 8/10. The pain is better with lying down and being still. Moving and lifting seems to increase the pain. Taking tylenol and gets some relief. Also took Advil. Associated symptoms include low grade fever, chills, nausea. Current sex partner x 7 years. Last pap smear less than one year and was normal. No history of STI's. Hx of ovarian cysts and had ovary removed on the right due to cyst. The history was provided by the patient.   OB History    Grav Para Term Preterm Abortions TAB SAB Ect Mult Living   4 1 1  0 3 1 2  0 0 1      Past Medical History  Diagnosis Date  . Asthma   . Reflux   . No pertinent past medical history     Past Surgical History  Procedure Date  . Oophorectomy   . Laproscopy 2010    cyst removal from ovary    Family History  Problem Relation Age of Onset  . Diabetes Mother   . Heart disease Father     History  Substance Use Topics  . Smoking status: Never Smoker   . Smokeless tobacco: Not on file  . Alcohol Use: No    Allergies:  Allergies  Allergen Reactions  . Chocolate Hives  . Citric Acid Hives  . Grapefruit Extract Hives  . Lemon Juice Hives  . Orange Fruit Hives  . Sulfa Antibiotics Nausea And Vomiting  . Tomato Hives    Prescriptions prior to admission  Medication Sig Dispense Refill  . acetaminophen (TYLENOL) 500 MG tablet Take 1,000 mg by mouth as needed. For headache, side pain      . diphenhydrAMINE (BENADRYL) 25 MG tablet Take 25 mg by mouth as needed. Allergic reaction      . fluconazole  (DIFLUCAN) 150 MG tablet Take 150 mg by mouth once.        . mometasone-formoterol (DULERA) 100-5 MCG/ACT AERO Inhale 2 puffs into the lungs 2 (two) times daily as needed. For wheezing, shortness of breath, or coughing       . Multiple Vitamins-Minerals (MULTIVITAMIN WITH MINERALS) tablet Take 1 tablet by mouth daily.        . Probiotic Product (FLORA-Q PO) Take 1 tablet by mouth daily.        . promethazine (PHENERGAN) 25 MG tablet Take 12.5 mg by mouth as needed. Nausea and vomiting       . Tetrahydrozoline HCl (VISINE OP) Place 2 drops into both eyes daily as needed. For itchy eyes       . DISCONTD: Norethin-Eth Estrad-Fe Biphas (LO LOESTRIN FE PO) Take 1 tablet by mouth daily.        Marland Kitchen DISCONTD: norgestrel-ethinyl estradiol (LO/OVRAL,CRYSELLE) 0.3-30 MG-MCG tablet Take 1 tablet by mouth daily.          Review of Systems  Constitutional: Positive for fever and chills. Negative for weight loss.  HENT: Negative for ear pain, nosebleeds, congestion, sore throat  and neck pain.   Eyes: Negative for blurred vision, double vision, photophobia and pain.  Respiratory: Negative for cough, shortness of breath and wheezing.   Cardiovascular: Negative for chest pain, palpitations and leg swelling.  Gastrointestinal: Positive for nausea and abdominal pain. Negative for heartburn, vomiting, diarrhea and constipation.  Genitourinary: Positive for frequency. Negative for dysuria and urgency.  Musculoskeletal: Negative for myalgias and back pain.  Skin: Negative for itching and rash.  Neurological: Positive for dizziness. Negative for sensory change, speech change, seizures, weakness and headaches.  Endo/Heme/Allergies: Does not bruise/bleed easily.  Psychiatric/Behavioral: Negative for depression. The patient is nervous/anxious.    Physical Exam   Blood pressure 111/81, pulse 118, temperature 98.3 F (36.8 C), resp. rate 16, height 5\' 4"  (1.626 m), weight 140 lb 9.6 oz (63.776 kg), last menstrual period  05/30/2012.  Physical Exam  Nursing note and vitals reviewed. Constitutional: She is oriented to person, place, and time. She appears well-developed and well-nourished. No distress.  HENT:  Head: Normocephalic and atraumatic.  Eyes: EOM are normal.  Neck: Neck supple.  Cardiovascular: Normal rate.   Respiratory: Effort normal.  GI: Soft. There is tenderness in the left lower quadrant. There is no rigidity, no rebound, no guarding and no CVA tenderness.  Genitourinary:       External genitalia without lesions. White discharge vaginal vault. Cervix inflamed. Positive CMT, left adnexal tenderness. Uterus slightly enlarged.  Musculoskeletal: Normal range of motion.  Neurological: She is alert and oriented to person, place, and time.  Skin: Skin is warm and dry.  Psychiatric: She has a normal mood and affect. Her behavior is normal. Judgment and thought content normal.   Results for orders placed during the hospital encounter of 07/08/12 (from the past 24 hour(s))  URINALYSIS, ROUTINE W REFLEX MICROSCOPIC     Status: Abnormal   Collection Time   07/08/12  9:05 PM      Component Value Range   Color, Urine YELLOW  YELLOW   APPearance CLEAR  CLEAR   Specific Gravity, Urine 1.010  1.005 - 1.030   pH 8.5 (*) 5.0 - 8.0   Glucose, UA NEGATIVE  NEGATIVE mg/dL   Hgb urine dipstick NEGATIVE  NEGATIVE   Bilirubin Urine NEGATIVE  NEGATIVE   Ketones, ur NEGATIVE  NEGATIVE mg/dL   Protein, ur NEGATIVE  NEGATIVE mg/dL   Urobilinogen, UA 0.2  0.0 - 1.0 mg/dL   Nitrite NEGATIVE  NEGATIVE   Leukocytes, UA NEGATIVE  NEGATIVE  POCT PREGNANCY, URINE     Status: Abnormal   Collection Time   07/08/12  9:23 PM      Component Value Range   Preg Test, Ur POSITIVE (*) NEGATIVE  CBC WITH DIFFERENTIAL     Status: Abnormal   Collection Time   07/08/12 10:20 PM      Component Value Range   WBC 10.1  4.0 - 10.5 K/uL   RBC 3.69 (*) 3.87 - 5.11 MIL/uL   Hemoglobin 11.7 (*) 12.0 - 15.0 g/dL   HCT 09.8 (*) 11.9 -  46.0 %   MCV 92.7  78.0 - 100.0 fL   MCH 31.7  26.0 - 34.0 pg   MCHC 34.2  30.0 - 36.0 g/dL   RDW 14.7  82.9 - 56.2 %   Platelets 311  150 - 400 K/uL   Neutrophils Relative 77  43 - 77 %   Neutro Abs 7.8 (*) 1.7 - 7.7 K/uL   Lymphocytes Relative 18  12 - 46 %  Lymphs Abs 1.8  0.7 - 4.0 K/uL   Monocytes Relative 5  3 - 12 %   Monocytes Absolute 0.5  0.1 - 1.0 K/uL   Eosinophils Relative 0  0 - 5 %   Eosinophils Absolute 0.0  0.0 - 0.7 K/uL   Basophils Relative 0  0 - 1 %   Basophils Absolute 0.0  0.0 - 0.1 K/uL  WET PREP, GENITAL     Status: Abnormal   Collection Time   07/08/12 11:00 PM      Component Value Range   Yeast Wet Prep HPF POC NONE SEEN  NONE SEEN   Trich, Wet Prep NONE SEEN  NONE SEEN   Clue Cells Wet Prep HPF POC NONE SEEN  NONE SEEN   WBC, Wet Prep HPF POC MODERATE (*) NONE SEEN   Procedures US Ob Comp Less 14 Wks  07/09/2012  *RADIOLOGY REPORT*  Clinical Data: Pelvic and left lower quadrant pain, positive urine pregnancy test; no quantitative beta HCG for correlation.  EGA [redacted] weeks 4 days by LMP  OBSTETRIC <14 WK [place] AND TRANSVAGINAL OB US  Technique:  Both transabdominal and transvaginal ultrasound examinations were performed for complete evaluation of the gestation as well as the maternal uterus, adnexal regions, and pelvic cul-de-sac.  Transvaginal technique was performed to assess early pregnancy.  Comparison:  None for this pregnancy  Intrauterine gestational sac:  Visualized/normal in shape. Yolk sac: Present Embryo: Not identified Cardiac Activity: N/A Heart Rate: N/A bpm  MSD: 9.7 mm     5 w   5 d     [place] EDC: 03/05/2013  Maternal uterus/adnexae: Tiny amount of endometrial fluid. No definite subchorionic hemorrhage. Left ovary measures 4.2 x 2.1 x 2.5 cm in size and contains a small corpus luteum 1.5 x 1.2 x 1.2 cm. Right ovary surgically absent by history. No free pelvic fluid or adnexal masses.  IMPRESSION: Intrauterine gestational sac with mean sac diameter  corresponding to an estimated gestational age of [redacted] weeks 5 days, congruent with LMP. A yolk sac is visualized but no fetal pole is identified to establish fetal viability. Consider follow-up imaging in 10 days to establish fetal viability. No other sonographic abnormalities identified.   Original Report Authenticated By: Lollie Marrow, M.D.    US Ob Transvaginal  07/09/2012  *RADIOLOGY REPORT*  Clinical Data: Pelvic and left lower quadrant pain, positive urine pregnancy test; no quantitative beta HCG for correlation.  EGA [redacted] weeks 4 days by LMP  OBSTETRIC <14 WK [place] AND TRANSVAGINAL OB US  Technique:  Both transabdominal and transvaginal ultrasound examinations were performed for complete evaluation of the gestation as well as the maternal uterus, adnexal regions, and pelvic cul-de-sac.  Transvaginal technique was performed to assess early pregnancy.  Comparison:  None for this pregnancy  Intrauterine gestational sac:  Visualized/normal in shape. Yolk sac: Present Embryo: Not identified Cardiac Activity: N/A Heart Rate: N/A bpm  MSD: 9.7 mm     5 w   5 d     [place] EDC: 03/05/2013  Maternal uterus/adnexae: Tiny amount of endometrial fluid. No definite subchorionic hemorrhage. Left ovary measures 4.2 x 2.1 x 2.5 cm in size and contains a small corpus luteum 1.5 x 1.2 x 1.2 cm. Right ovary surgically absent by history. No free pelvic fluid or adnexal masses.  IMPRESSION: Intrauterine gestational sac with mean sac diameter corresponding to an estimated gestational age of [redacted] weeks 5 days, congruent with LMP. A yolk sac is visualized but no  fetal pole is identified to establish fetal viability. Consider follow-up imaging in 10 days to establish fetal viability. No other sonographic abnormalities identified.   Original Report Authenticated By: Lollie Marrow, M.D.    Assessment: 31 y.o. female @ 5 weeks and 4 days gestation with pelvic pain   Left CLC  Plan:  Discussed with Dr. Arelia Sneddon and will have patient follow up in the  office in one week   Tylenol as needed for discomfort  Discussed with the patient and all questioned fully answered. She will follow up in the office or return here if any problems arise. Medication List  As of 07/09/2012 12:33 AM   CONTINUE taking these medications         acetaminophen 500 MG tablet   Commonly known as: TYLENOL      diphenhydrAMINE 25 MG tablet   Commonly known as: BENADRYL      DULERA 100-5 MCG/ACT Aero   Generic drug: mometasone-formoterol      FLORA-Q PO      multivitamin with minerals tablet      promethazine 25 MG tablet   Commonly known as: PHENERGAN      VISINE OP         STOP taking these medications         fluconazole 150 MG tablet      LO LOESTRIN FE PO      norgestrel-ethinyl estradiol 0.3-30 MG-MCG tablet           Follow-up Information    Call Juluis Mire, MD. (follow up in one week)    Contact information:   [facility] [address] [place] [address] [phone]         Follow-up Information    Call Juluis Mire, MD. (follow up in one week)    Contact information:   [facility] [address], [address] [place] [address] [phone]          Kerrie Buffalo, RN, FNP, [facility] 07/08/2012, 10:09 PM

## 2012-07-08 NOTE — MAU Note (Signed)
Pt LMP 05/30/2012, hx irreg periods, LLQ pain x 1 week, chills x 2 days.  Pt had right ovary removed 07/2009 due to cyst.

## 2012-07-08 NOTE — MAU Note (Signed)
Pt states she is having right side sharp pain, irregular periods that have been irregular since January.Pt states periods are coming "either the first week in the month or the last week in the month.

## 2012-07-09 LAB — GC/CHLAMYDIA PROBE AMP, GENITAL: GC Probe Amp, Genital: NEGATIVE

## 2013-02-12 ENCOUNTER — Other Ambulatory Visit: Payer: Self-pay | Admitting: Obstetrics and Gynecology

## 2014-03-23 ENCOUNTER — Other Ambulatory Visit: Payer: Self-pay | Admitting: Obstetrics and Gynecology

## 2014-05-07 ENCOUNTER — Encounter (HOSPITAL_BASED_OUTPATIENT_CLINIC_OR_DEPARTMENT_OTHER): Payer: Self-pay | Admitting: Emergency Medicine

## 2014-05-07 ENCOUNTER — Emergency Department (HOSPITAL_BASED_OUTPATIENT_CLINIC_OR_DEPARTMENT_OTHER)
Admission: EM | Admit: 2014-05-07 | Discharge: 2014-05-07 | Disposition: A | Discharge status: Home / Self Care | Payer: No Typology Code available for payment source | Attending: Emergency Medicine | Admitting: Emergency Medicine

## 2014-05-07 DIAGNOSIS — Y92410 Unspecified street and highway as the place of occurrence of the external cause: Secondary | ICD-10-CM | POA: Insufficient documentation

## 2014-05-07 DIAGNOSIS — Y9389 Activity, other specified: Secondary | ICD-10-CM | POA: Diagnosis not present

## 2014-05-07 DIAGNOSIS — J45909 Unspecified asthma, uncomplicated: Secondary | ICD-10-CM | POA: Insufficient documentation

## 2014-05-07 DIAGNOSIS — Z79899 Other long term (current) drug therapy: Secondary | ICD-10-CM | POA: Diagnosis not present

## 2014-05-07 DIAGNOSIS — S0993XA Unspecified injury of face, initial encounter: Secondary | ICD-10-CM | POA: Diagnosis present

## 2014-05-07 DIAGNOSIS — K219 Gastro-esophageal reflux disease without esophagitis: Secondary | ICD-10-CM | POA: Insufficient documentation

## 2014-05-07 DIAGNOSIS — S139XXA Sprain of joints and ligaments of unspecified parts of neck, initial encounter: Secondary | ICD-10-CM | POA: Diagnosis not present

## 2014-05-07 DIAGNOSIS — S161XXA Strain of muscle, fascia and tendon at neck level, initial encounter: Secondary | ICD-10-CM

## 2014-05-07 MED ORDER — METHOCARBAMOL 500 MG PO TABS: 500.0000 mg | ORAL_TABLET | Freq: Two times a day (BID) | ORAL | Status: DC

## 2014-05-07 MED ORDER — IBUPROFEN 800 MG PO TABS: 800.0000 mg | ORAL_TABLET | Freq: Three times a day (TID) | ORAL | Status: DC
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
## 2014-05-09 NOTE — ED Provider Notes (Signed)
Medical screening examination/treatment/procedure(s) were performed by non-physician practitioner and as supervising physician I was immediately available for consultation/collaboration.   EKG Interpretation None        Ephraim Hamburger, MD 05/09/14 1158

## 2014-08-30 ENCOUNTER — Other Ambulatory Visit: Payer: Self-pay | Admitting: Family Medicine

## 2014-08-30 DIAGNOSIS — M25561 Pain in right knee: Secondary | ICD-10-CM

## 2014-09-02 ENCOUNTER — Other Ambulatory Visit: Payer: No Typology Code available for payment source

## 2014-09-05 ENCOUNTER — Encounter (HOSPITAL_BASED_OUTPATIENT_CLINIC_OR_DEPARTMENT_OTHER): Payer: Self-pay | Admitting: Emergency Medicine

## 2014-11-04 HISTORY — PX: ABLATION ON ENDOMETRIOSIS: SHX5787

## 2015-05-10 ENCOUNTER — Other Ambulatory Visit: Payer: Self-pay | Admitting: Obstetrics and Gynecology

## 2015-05-11 LAB — CYTOLOGY - PAP

## 2015-07-08 ENCOUNTER — Emergency Department (HOSPITAL_COMMUNITY)
Admission: EM | Admit: 2015-07-08 | Discharge: 2015-07-08 | Disposition: A | Discharge status: Home / Self Care | Payer: No Typology Code available for payment source | Attending: Emergency Medicine | Admitting: Emergency Medicine

## 2015-07-08 ENCOUNTER — Emergency Department (HOSPITAL_COMMUNITY): Payer: No Typology Code available for payment source

## 2015-07-08 ENCOUNTER — Encounter (HOSPITAL_COMMUNITY): Payer: Self-pay | Admitting: Emergency Medicine

## 2015-07-08 DIAGNOSIS — R079 Chest pain, unspecified: Secondary | ICD-10-CM | POA: Diagnosis present

## 2015-07-08 DIAGNOSIS — R791 Abnormal coagulation profile: Secondary | ICD-10-CM | POA: Diagnosis not present

## 2015-07-08 DIAGNOSIS — R531 Weakness: Secondary | ICD-10-CM | POA: Insufficient documentation

## 2015-07-08 DIAGNOSIS — Z3202 Encounter for pregnancy test, result negative: Secondary | ICD-10-CM | POA: Diagnosis not present

## 2015-07-08 DIAGNOSIS — R42 Dizziness and giddiness: Secondary | ICD-10-CM | POA: Insufficient documentation

## 2015-07-08 DIAGNOSIS — Z79899 Other long term (current) drug therapy: Secondary | ICD-10-CM | POA: Insufficient documentation

## 2015-07-08 DIAGNOSIS — J45901 Unspecified asthma with (acute) exacerbation: Secondary | ICD-10-CM | POA: Diagnosis not present

## 2015-07-08 DIAGNOSIS — R0789 Other chest pain: Secondary | ICD-10-CM

## 2015-07-08 DIAGNOSIS — K219 Gastro-esophageal reflux disease without esophagitis: Secondary | ICD-10-CM | POA: Insufficient documentation

## 2015-07-08 HISTORY — DX: Bronchitis, not specified as acute or chronic: J40

## 2015-07-08 LAB — BASIC METABOLIC PANEL
ANION GAP: 7 (ref 5–15)
BUN: 11 mg/dL (ref 6–20)
CALCIUM: 9 mg/dL (ref 8.9–10.3)
CO2: 25 mmol/L (ref 22–32)
CREATININE: 0.86 mg/dL (ref 0.44–1.00)
Chloride: 107 mmol/L (ref 101–111)
Glucose, Bld: 96 mg/dL (ref 65–99)
Potassium: 3.6 mmol/L (ref 3.5–5.1)
Sodium: 139 mmol/L (ref 135–145)

## 2015-07-08 LAB — URINALYSIS, ROUTINE W REFLEX MICROSCOPIC
BILIRUBIN URINE: NEGATIVE
GLUCOSE, UA: NEGATIVE mg/dL
Hgb urine dipstick: NEGATIVE
KETONES UR: NEGATIVE mg/dL
Nitrite: NEGATIVE
PH: 8 (ref 5.0–8.0)
PROTEIN: NEGATIVE mg/dL
Specific Gravity, Urine: 1.013 (ref 1.005–1.030)
Urobilinogen, UA: 0.2 mg/dL (ref 0.0–1.0)

## 2015-07-08 LAB — CBC
HCT: 35.6 % — ABNORMAL LOW (ref 36.0–46.0)
HEMOGLOBIN: 11.9 g/dL — AB (ref 12.0–15.0)
MCH: 31.5 pg (ref 26.0–34.0)
MCHC: 33.4 g/dL (ref 30.0–36.0)
MCV: 94.2 fL (ref 78.0–100.0)
PLATELETS: 301 10*3/uL (ref 150–400)
RBC: 3.78 MIL/uL — AB (ref 3.87–5.11)
RDW: 11.8 % (ref 11.5–15.5)
WBC: 6.8 10*3/uL (ref 4.0–10.5)

## 2015-07-08 LAB — I-STAT TROPONIN, ED
Troponin i, poc: 0 ng/mL (ref 0.00–0.08)
Troponin i, poc: 0 ng/mL (ref 0.00–0.08)

## 2015-07-08 LAB — PREGNANCY, URINE: PREG TEST UR: NEGATIVE

## 2015-07-08 LAB — URINE MICROSCOPIC-ADD ON

## 2015-07-08 MED ORDER — IOHEXOL 350 MG/ML SOLN
100.0000 mL | Freq: Once | INTRAVENOUS | Status: AC | PRN
  Administered 2015-07-08: 100 mL via INTRAVENOUS

## 2015-07-08 NOTE — ED Notes (Signed)
Patient transported to CT 

## 2015-07-08 NOTE — ED Provider Notes (Signed)
CSN: [id number]     Arrival date & time 07/08/15  1035 History   First MD Initiated Contact with Patient 07/08/15 1049     Chief Complaint  Patient presents with  . Chest Pain  . elevated d-dimer      (Consider location/radiation/quality/duration/timing/severity/associated sxs/prior Treatment) HPI Comments: Sent by PCP for elevated d-dimer. Patient reports she's been having central chest pain for the past 3 weeks. Pain is in the center of her chest lasting several seconds to minutes at a time. It comes and goes and is worse with palpation. Denies any radiation of the pain. Denies shortness of breath. Her PCP told her it could be a sinus infection or bronchitis. She completed a course of prednisone. She was told that the chest pain is likely costochondritis. She is on birth control. She denies any leg pain or leg swelling. She denies any abdominal pain nausea or vomiting. She has intermittent spells of dizziness when she gets the chest pain described as lightheadedness, not vertigo.Carolyn Stare  The history is provided by the patient.    Past Medical History  Diagnosis Date  . Asthma   . Reflux   . No pertinent past medical history   . Bronchitis    Past Surgical History  Procedure Laterality Date  . Oophorectomy    . Laproscopy  2010    cyst removal from ovary   Family History  Problem Relation Age of Onset  . Diabetes Mother   . Heart disease Father    Social History  Substance Use Topics  . Smoking status: Never Smoker   . Smokeless tobacco: Not on file  . Alcohol Use: No   OB History    Gravida Para Term Preterm AB TAB SAB Ectopic Multiple Living   4 1 1  0 3 1 2  0 0 1     Review of Systems  Constitutional: Negative for fever, activity change and appetite change.  HENT: Negative for congestion and rhinorrhea.   Respiratory: Positive for chest tightness and shortness of breath. Negative for cough.   Cardiovascular: Positive for chest pain.  Gastrointestinal: Negative for  nausea, vomiting and abdominal pain.  Genitourinary: Negative for dysuria, hematuria, vaginal bleeding and vaginal discharge.  Musculoskeletal: Negative for myalgias and arthralgias.  Skin: Negative for rash.  Neurological: Positive for dizziness, weakness and light-headedness. Negative for headaches.   A complete 10 system review of systems was obtained and all systems are negative except as noted in the HPI and PMH.     Allergies  Chocolate; Citric acid; Grapefruit extract; Lemon juice; Orange fruit; Sulfa antibiotics; and Tomato  Home Medications   Prior to Admission medications   Medication Sig Start Date End Date Taking? Authorizing Provider  acetaminophen (TYLENOL) 500 MG tablet Take 1,000 mg by mouth every 4 (four) hours as needed for moderate pain. For headache, side pain   Yes Historical Provider, MD  albuterol (PROVENTIL HFA;VENTOLIN HFA) 108 (90 BASE) MCG/ACT inhaler Inhale into the lungs every 6 (six) hours as needed for wheezing or shortness of breath.   Yes Historical Provider, MD  ALPRAZolam Duanne Moron) 0.5 MG tablet Take 0.5 mg by mouth 2 (two) times daily as needed for anxiety or sleep.  07/04/15  Yes Historical Provider, MD  AMETHYST 90-20 MCG tablet Take 1 tablet by mouth daily.  06/15/15  Yes Historical Provider, MD  Ascorbic Acid (VITAMIN C PO) Take 1 tablet by mouth daily.   Yes Historical Provider, MD  B Complex-C (B-COMPLEX WITH VITAMIN C) tablet Take  1 tablet by mouth daily.   Yes Historical Provider, MD  diphenhydrAMINE (BENADRYL) 25 MG tablet Take 25 mg by mouth daily as needed for allergies. Allergic reaction   Yes Historical Provider, MD  meloxicam (MOBIC) 15 MG tablet Take 15 mg by mouth daily.  07/07/15  Yes Historical Provider, MD  Multiple Vitamins-Minerals (MULTIVITAMIN WITH MINERALS) tablet Take 1 tablet by mouth daily.     Yes Historical Provider, MD  oxyCODONE-acetaminophen (PERCOCET) 10-325 MG per tablet 1 tablet every 6 (six) hours as needed for pain.  06/14/15   Yes Historical Provider, MD  pantoprazole (PROTONIX) 40 MG tablet Take 40 mg by mouth daily.   Yes Historical Provider, MD  Probiotic Product (FLORA-Q PO) Take 1 tablet by mouth daily.     Yes Historical Provider, MD  PROCTOFOAM [facility] rectal foam Place 1 applicator rectally 3 (three) times daily as needed for hemorrhoids.  07/03/15  Yes Historical Provider, MD  promethazine (PHENERGAN) 25 MG tablet Take 12.5 mg by mouth every 8 (eight) hours as needed for nausea. Nausea and vomiting   Yes Historical Provider, MD  Vitamin Mixture (VITAMIN E COMPLETE) CAPS Take 1 capsule by mouth daily.   Yes Historical Provider, MD  ibuprofen (ADVIL,MOTRIN) 800 MG tablet Take 1 tablet (800 mg total) by mouth 3 (three) times daily. Patient not taking: Reported on 07/08/2015 05/07/14   Fransico Meadow, PA-C  methocarbamol (ROBAXIN) 500 MG tablet Take 1 tablet (500 mg total) by mouth 2 (two) times daily. Patient not taking: Reported on 07/08/2015 05/07/14   Fransico Meadow, PA-C   BP 107/64 mmHg  Pulse 86  Temp(Src) 98.1 F (36.7 C) (Oral)  Resp 17  SpO2 100%  LMP 06/24/2015 Physical Exam  Constitutional: She is oriented to person, place, and time. She appears well-developed and well-nourished. No distress.  HENT:  Head: Normocephalic and atraumatic.  Mouth/Throat: Oropharynx is clear and moist. No oropharyngeal exudate.  Eyes: Conjunctivae and EOM are normal. Pupils are equal, round, and reactive to light.  Neck: Normal range of motion. Neck supple.  No meningismus.  Cardiovascular: Normal rate, regular rhythm, normal heart sounds and intact distal pulses.   No murmur heard. Pulmonary/Chest: Effort normal and breath sounds normal. No respiratory distress. She exhibits tenderness.  Central chest tenderness  Abdominal: Soft. There is no tenderness. There is no rebound and no guarding.  Musculoskeletal: Normal range of motion. She exhibits no edema or tenderness.  Neurological: She is alert and oriented to person,  place, and time. No cranial nerve deficit. She exhibits normal muscle tone. Coordination normal.  No ataxia on finger to nose bilaterally. No pronator drift. 5/5 strength throughout. CN 2-12 intact. Equal grip strength. Sensation intact.   Skin: Skin is warm.  Psychiatric: She has a normal mood and affect. Her behavior is normal.  Nursing note and vitals reviewed.   ED Course  Procedures (including critical care time) Labs Review Labs Reviewed  CBC - Abnormal; Notable for the following:    RBC 3.78 (*)    Hemoglobin 11.9 (*)    HCT 35.6 (*)    All other components within normal limits  URINALYSIS, ROUTINE W REFLEX MICROSCOPIC (NOT AT [facility]) - Abnormal; Notable for the following:    Leukocytes, UA TRACE (*)    All other components within normal limits  URINE MICROSCOPIC-ADD ON - Abnormal; Notable for the following:    Squamous Epithelial / LPF FEW (*)    Bacteria, UA FEW (*)    All other components  within normal limits  BASIC METABOLIC PANEL  PREGNANCY, URINE  I-STAT TROPOININ, ED  I-STAT TROPOININ, ED    Imaging Review Dg Chest 2 View  07/08/2015   CLINICAL DATA:  Chest pain, dizziness for 3 weeks  EXAM: CHEST  2 VIEW  COMPARISON:  07/24/2011  FINDINGS: The heart size and mediastinal contours are within normal limits. Both lungs are clear. The visualized skeletal structures are unremarkable.  IMPRESSION: No active cardiopulmonary disease.   Electronically Signed   By: Kathreen Devoid   On: 07/08/2015 11:05   Ct Angio Chest Pe W/cm &/or Wo Cm  07/08/2015   CLINICAL DATA:  34 year old female with chest pain and elevated D-dimer  EXAM: CT ANGIOGRAPHY CHEST WITH CONTRAST  TECHNIQUE: Multidetector CT imaging of the chest was performed using the standard protocol during bolus administration of intravenous contrast. Multiplanar CT image reconstructions and MIPs were obtained to evaluate the vascular anatomy.  CONTRAST:  191mL OMNIPAQUE IOHEXOL 350 MG/ML SOLN  COMPARISON:  Chest x-ray obtained  earlier today; prior CT scan of the chest 10/08/2006  FINDINGS: Mediastinum: Unremarkable CT appearance of the thyroid gland. No suspicious mediastinal or hilar adenopathy. Strandy triangular soft tissue in the anterior mediastinum consistent with residual thymus. The thoracic esophagus is unremarkable.  Heart/Vascular: Adequate opacification of the pulmonary arteries to the proximal subsegmental level. There is no evidence of central filling defect to suggest acute pulmonary embolus. Conventional 3 vessel aortic arch anatomy. No evidence of aneurysm. The heart appears within normal limits in size. No pericardial effusion.  Lungs/Pleura: The lungs are clear.  Bones/Soft Tissues: No acute fracture or aggressive appearing lytic or blastic osseous lesion.  Upper Abdomen: Visualized upper abdominal organs are unremarkable.  Review of the MIP images confirms the above findings.  IMPRESSION: No evidence of acute pulmonary embolus, pneumonia or other acute cardiopulmonary process.   Electronically Signed   By: Jacqulynn Cadet M.D.   On: 07/08/2015 12:43   I have personally reviewed and evaluated these images and lab results as part of my medical decision-making.   EKG Interpretation   Date/Time:  Saturday July 08 2015 10:43:54 EDT Ventricular Rate:  89 PR Interval:  137 QRS Duration: 83 QT Interval:  353 QTC Calculation: 429 R Axis:   42 Text Interpretation:  Sinus rhythm Consider right atrial enlargement No  significant change was found Confirmed by Wyvonnia Dusky  MD, Raileigh Sabater ([id number]) on  07/08/2015 11:02:41 AM      MDM   Final diagnoses:  Chest pain  Chest wall pain   3 weeks of intermittent central chest pain. EKG with normal sinus rhythm. Nonspecific ST changes. Chest x-ray is negative.  Outpatient d-dimer was positive and patient is on birth control. There is a low suspicion for PE.   CT negative for PE or other acute pathology.  Patient's chest pain is atypical for ACS. It is  reproducible to palpation and ongoing for the past 3 weeks. EKG is unchanged. Troponin is negative.   patient reassured. She appears stable for discharge. No further indication for antibiotics or steroids. Follow up with PCP. Return precautions discussed.   Ezequiel Essex, MD 07/08/15 [phone]

## 2015-07-08 NOTE — ED Notes (Signed)
Pt states that center chest pain started 3 weeks ago.  Pt states that she went to her doctor and was diagnosed with sinus infection.  Then she went back to doctor and was told bronchitis.  Pt was given steroids.  Pt states that she was called today to go to ED bc her d-dimer was elevated.

## 2015-07-08 NOTE — ED Notes (Signed)
Per pt, states she was at PCP office yesterday for same symptoms-states she has an enlarged right atrium-states elevated d-dimer-has been diagnosed with bronchitis

## 2015-07-08 NOTE — ED Notes (Signed)
Patient transported to X-ray 

## 2015-07-08 NOTE — Discharge Instructions (Signed)
Chest Pain (Nonspecific)  there is no evidence of heart attack or blood clot in the lung. Follow-up with your primary care doctor. Return to the ED if if you develops worsening symptoms. It is often hard to give a specific diagnosis for the cause of chest pain. There is always a chance that your pain could be related to something serious, such as a heart attack or a blood clot in the lungs. You need to follow up with your health care provider for further evaluation. CAUSES   Heartburn.  Pneumonia or bronchitis.  Anxiety or stress.  Inflammation around your heart (pericarditis) or lung (pleuritis or pleurisy).  A blood clot in the lung.  A collapsed lung (pneumothorax). It can develop suddenly on its own (spontaneous pneumothorax) or from trauma to the chest.  Shingles infection (herpes zoster virus). The chest wall is composed of bones, muscles, and cartilage. Any of these can be the source of the pain.  The bones can be bruised by injury.  The muscles or cartilage can be strained by coughing or overwork.  The cartilage can be affected by inflammation and become sore (costochondritis). DIAGNOSIS  Lab tests or other studies may be needed to find the cause of your pain. Your health care provider may have you take a test called an ambulatory electrocardiogram (ECG). An ECG records your heartbeat patterns over a 24-hour period. You may also have other tests, such as:  Transthoracic echocardiogram (TTE). During echocardiography, sound waves are used to evaluate how blood flows through your heart.  Transesophageal echocardiogram (TEE).  Cardiac monitoring. This allows your health care provider to monitor your heart rate and rhythm in real time.  Holter monitor. This is a portable device that records your heartbeat and can help diagnose heart arrhythmias. It allows your health care provider to track your heart activity for several days, if needed.  Stress tests by exercise or by giving  medicine that makes the heart beat faster. TREATMENT   Treatment depends on what may be causing your chest pain. Treatment may include:  Acid blockers for heartburn.  Anti-inflammatory medicine.  Pain medicine for inflammatory conditions.  Antibiotics if an infection is present.  You may be advised to change lifestyle habits. This includes stopping smoking and avoiding alcohol, caffeine, and chocolate.  You may be advised to keep your head raised (elevated) when sleeping. This reduces the chance of acid going backward from your stomach into your esophagus. Most of the time, nonspecific chest pain will improve within 2-3 days with rest and mild pain medicine.  HOME CARE INSTRUCTIONS   If antibiotics were prescribed, take them as directed. Finish them even if you start to feel better.  For the next few days, avoid physical activities that bring on chest pain. Continue physical activities as directed.  Do not use any tobacco products, including cigarettes, chewing tobacco, or electronic cigarettes.  Avoid drinking alcohol.  Only take medicine as directed by your health care provider.  Follow your health care provider's suggestions for further testing if your chest pain does not go away.  Keep any follow-up appointments you made. If you do not go to an appointment, you could develop lasting (chronic) problems with pain. If there is any problem keeping an appointment, call to reschedule. SEEK MEDICAL CARE IF:   Your chest pain does not go away, even after treatment.  You have a rash with blisters on your chest.  You have a fever. SEEK IMMEDIATE MEDICAL CARE IF:   You have  increased chest pain or pain that spreads to your arm, neck, jaw, back, or abdomen.  You have shortness of breath.  You have an increasing cough, or you cough up blood.  You have severe back or abdominal pain.  You feel nauseous or vomit.  You have severe weakness.  You faint.  You have  chills. This is an emergency. Do not wait to see if the pain will go away. Get medical help at once. Call your local emergency services (911 in [place] Do not drive yourself to the hospital. MAKE SURE YOU:   Understand these instructions.  Will watch your condition.  Will get help right away if you are not doing well or get worse. Document Released: 07/31/2005 Document Revised: 10/26/2013 Document Reviewed: 05/26/2008 [facility] Patient Information 2015 [place], [place]. This information is not intended to replace advice given to you by your health care provider. Make sure you discuss any questions you have with your health care provider.

## 2015-08-17 ENCOUNTER — Inpatient Hospital Stay (HOSPITAL_COMMUNITY)
Admission: AD | Admit: 2015-08-17 | Discharge: 2015-08-17 | Disposition: A | Discharge status: Home / Self Care | Payer: No Typology Code available for payment source | Source: Ambulatory Visit | Attending: Obstetrics and Gynecology | Admitting: Obstetrics and Gynecology

## 2015-08-17 ENCOUNTER — Encounter (HOSPITAL_COMMUNITY): Payer: Self-pay

## 2015-08-17 ENCOUNTER — Inpatient Hospital Stay (HOSPITAL_COMMUNITY): Payer: No Typology Code available for payment source

## 2015-08-17 DIAGNOSIS — R1032 Left lower quadrant pain: Secondary | ICD-10-CM | POA: Diagnosis present

## 2015-08-17 DIAGNOSIS — R102 Pelvic and perineal pain: Secondary | ICD-10-CM | POA: Insufficient documentation

## 2015-08-17 DIAGNOSIS — K219 Gastro-esophageal reflux disease without esophagitis: Secondary | ICD-10-CM | POA: Diagnosis not present

## 2015-08-17 DIAGNOSIS — D259 Leiomyoma of uterus, unspecified: Secondary | ICD-10-CM | POA: Diagnosis not present

## 2015-08-17 DIAGNOSIS — J45909 Unspecified asthma, uncomplicated: Secondary | ICD-10-CM | POA: Insufficient documentation

## 2015-08-17 HISTORY — DX: Endometriosis, unspecified: N80.9

## 2015-08-17 LAB — URINE MICROSCOPIC-ADD ON

## 2015-08-17 LAB — CBC
HEMATOCRIT: 33.2 % — AB (ref 36.0–46.0)
HEMOGLOBIN: 11.2 g/dL — AB (ref 12.0–15.0)
MCH: 31.4 pg (ref 26.0–34.0)
MCHC: 33.7 g/dL (ref 30.0–36.0)
MCV: 93 fL (ref 78.0–100.0)
Platelets: 299 10*3/uL (ref 150–400)
RBC: 3.57 MIL/uL — AB (ref 3.87–5.11)
RDW: 12.1 % (ref 11.5–15.5)
WBC: 6.5 10*3/uL (ref 4.0–10.5)

## 2015-08-17 LAB — URINALYSIS, ROUTINE W REFLEX MICROSCOPIC
Bilirubin Urine: NEGATIVE
GLUCOSE, UA: NEGATIVE mg/dL
Ketones, ur: NEGATIVE mg/dL
Nitrite: NEGATIVE
Protein, ur: NEGATIVE mg/dL
SPECIFIC GRAVITY, URINE: 1.015 (ref 1.005–1.030)
Urobilinogen, UA: 0.2 mg/dL (ref 0.0–1.0)
pH: 6.5 (ref 5.0–8.0)

## 2015-08-17 LAB — COMPREHENSIVE METABOLIC PANEL
ALBUMIN: 3.7 g/dL (ref 3.5–5.0)
ALK PHOS: 69 U/L (ref 38–126)
ALT: 18 U/L (ref 14–54)
ANION GAP: 5 (ref 5–15)
AST: 24 U/L (ref 15–41)
BILIRUBIN TOTAL: 0.8 mg/dL (ref 0.3–1.2)
BUN: 12 mg/dL (ref 6–20)
CALCIUM: 8.3 mg/dL — AB (ref 8.9–10.3)
CO2: 25 mmol/L (ref 22–32)
CREATININE: 0.74 mg/dL (ref 0.44–1.00)
Chloride: 104 mmol/L (ref 101–111)
GFR calc non Af Amer: >60 mL/min (ref >60)
GLUCOSE: 119 mg/dL — AB (ref 65–99)
Potassium: 3.2 mmol/L — ABNORMAL LOW (ref 3.5–5.1)
Sodium: 134 mmol/L — ABNORMAL LOW (ref 135–145)
TOTAL PROTEIN: 6.6 g/dL (ref 6.5–8.1)

## 2015-08-17 LAB — WET PREP, GENITAL
CLUE CELLS WET PREP: NONE SEEN
Trich, Wet Prep: NONE SEEN
Yeast Wet Prep HPF POC: NONE SEEN

## 2015-08-17 LAB — POCT PREGNANCY, URINE: Preg Test, Ur: NEGATIVE

## 2015-08-17 MED ORDER — KETOROLAC TROMETHAMINE 60 MG/2ML IM SOLN
60.0000 mg | Freq: Once | INTRAMUSCULAR | Status: AC
  Administered 2015-08-17: 60 mg via INTRAMUSCULAR
  Filled 2015-08-17: qty 2

## 2015-08-17 NOTE — Discharge Instructions (Signed)
Endometriosis Endometriosis is a condition in which the tissue that lines the uterus (endometrium) grows outside of its normal location. The tissue may grow in many locations close to the uterus, but it commonly grows on the ovaries, fallopian tubes, vagina, or bowel. Because the uterus expels, or sheds, its lining every menstrual cycle, there is bleeding wherever the endometrial tissue is located. This can cause pain because blood is irritating to tissues not normally exposed to it.  CAUSES  The cause of endometriosis is not known.  SIGNS AND SYMPTOMS  Often, there are no symptoms. When symptoms are present, they can vary with the location of the displaced tissue. Various symptoms can occur at different times. Although symptoms occur mainly during a woman's menstrual period, they can also occur midcycle and usually stop with menopause. Some people may go months with no symptoms at all. Symptoms may include:   Back or abdominal pain.   Heavier bleeding during periods.   Pain during intercourse.   Painful bowel movements.   Infertility. DIAGNOSIS  Your health care provider will do a physical exam and ask about your symptoms. Various tests may be done, such as:   Blood tests and urine tests. These are done to help rule out other problems.   Ultrasound. This test is done to look for abnormal tissue.   An X-ray of the lower bowel (barium enema).  Laparoscopy. In this procedure, a thin, lighted tube with a tiny camera on the end (laparoscope) is inserted into your abdomen. This helps your health care provider look for abnormal tissue to confirm the diagnosis. The health care provider may also remove a small piece of tissue (biopsy) from any abnormal tissue found. This tissue sample can then be sent to a lab so it can be looked at under a microscope. TREATMENT  Treatment will vary and may include:   Medicines to relieve pain. Nonsteroidal anti-inflammatory drugs (NSAIDs) are a type of  pain medicine that can help to relieve the pain caused by endometriosis.  Hormonal therapy. When using hormonal therapy, periods are eliminated. This eliminates the monthly exposure to blood by the displaced endometrial tissue.   Surgery. Surgery may sometimes be done to remove the abnormal endometrial tissue. In severe cases, surgery may be done to remove the fallopian tubes, uterus, and ovaries (hysterectomy). HOME CARE INSTRUCTIONS   Take all medicines as directed by your health care provider. Do not take aspirin because it may increase bleeding when you are not on hormonal therapy.   Avoid activities that produce pain, including sexual activity. SEEK MEDICAL CARE IF:  You have pelvic pain before, after, or during your periods.  You have pelvic pain between periods that gets worse during your period.  You have pelvic pain during or after sex.  You have pelvic pain with bowel movements or urination, especially during your period.  You have problems getting pregnant.  You have a fever. SEEK IMMEDIATE MEDICAL CARE IF:   Your pain is severe and is not responding to pain medicine.   You have severe nausea and vomiting, or you cannot keep foods down.   You have pain that is limited to the right lower part of your abdomen.   You have swelling or increasing pain in your abdomen.   You see blood in your stool.  MAKE SURE YOU:   Understand these instructions.  Will watch your condition.  Will get help right away if you are not doing well or get worse.   This information   is not intended to replace advice given to you by your health care provider. Make sure you discuss any questions you have with your health care provider.   Document Released: 10/18/2000 Document Revised: 11/11/2014 Document Reviewed: 06/18/2013 [employer] Interactive Patient Education 2016 [employer]  

## 2015-08-17 NOTE — MAU Provider Note (Signed)
History     CSN: [id number]  Arrival date and time: 08/17/15 0504   None     No chief complaint on file.  HPI Comments: Monique Hughes is a 34 y.o. [phone] who presents today with LLQ pain. She states that she has known endometriosis. She states that she was started on OCPs in an attempt to manage sx, but she could not tolerate that medication. She had a period on 08/07/15, and since then she has had increasing pain. Around 0400 this morning it became much worse. She took a Ambulance person, but states that it did not help. She states that she has also had a lot of bladder pressure. She had a refill of Cipro that she took last week because she thought maybe she had a UTI. The pain was somewhat better after taking the cipro, and has not returned.   Abdominal Pain This is a new problem. The current episode started today. The onset quality is sudden. The problem occurs constantly. The problem has been unchanged. The pain is located in the LLQ. The pain is severe. The quality of the pain is cramping. The abdominal pain does not radiate. Pertinent negatives include no constipation, diarrhea, dysuria ("pressure in her bladder when voiding"), fever, frequency, nausea or vomiting. Nothing aggravates the pain. The pain is relieved by nothing. She has tried oral narcotic analgesics for the symptoms. The treatment provided mild relief. Prior diagnostic workup includes surgery.   Past Medical History  Diagnosis Date  . Asthma   . Reflux   . No pertinent past medical history   . Bronchitis   . Endometriosis     Past Surgical History  Procedure Laterality Date  . Oophorectomy    . Laproscopy  2010    cyst removal from ovary    Family History  Problem Relation Age of Onset  . Diabetes Mother   . Heart disease Father     Social History  Substance Use Topics  . Smoking status: Never Smoker   . Smokeless tobacco: None  . Alcohol Use: No    Allergies:  Allergies  Allergen Reactions  .  Chocolate Hives  . Citric Acid Hives  . Doxycycline     headache  . Grapefruit Extract Hives  . Lemon Juice Hives  . Orange Fruit Hives  . Sulfa Antibiotics Nausea And Vomiting  . Tomato Hives    Prescriptions prior to admission  Medication Sig Dispense Refill Last Dose  . oxyCODONE-acetaminophen (PERCOCET) 10-325 MG per tablet 1 tablet every 6 (six) hours as needed for pain.    08/16/2015 at Unknown time  . acetaminophen (TYLENOL) 500 MG tablet Take 1,000 mg by mouth every 4 (four) hours as needed for moderate pain. For headache, side pain   More than a month at Unknown time  . albuterol (PROVENTIL HFA;VENTOLIN HFA) 108 (90 BASE) MCG/ACT inhaler Inhale into the lungs every 6 (six) hours as needed for wheezing or shortness of breath.   07/07/2015 at Unknown time  . ALPRAZolam (XANAX) 0.5 MG tablet Take 0.5 mg by mouth 2 (two) times daily as needed for anxiety or sleep.    Past Week at Unknown time  . AMETHYST 90-20 MCG tablet Take 1 tablet by mouth daily.    07/07/2015 at Unknown time  . Ascorbic Acid (VITAMIN C PO) Take 1 tablet by mouth daily.   07/07/2015 at Unknown time  . B Complex-C (B-COMPLEX WITH VITAMIN C) tablet Take 1 tablet by mouth daily.   07/07/2015 at  Unknown time  . diphenhydrAMINE (BENADRYL) 25 MG tablet Take 25 mg by mouth daily as needed for allergies. Allergic reaction   Past Month at Unknown time  . ibuprofen (ADVIL,MOTRIN) 800 MG tablet Take 1 tablet (800 mg total) by mouth 3 (three) times daily. (Patient not taking: Reported on 07/08/2015) 21 tablet 0 Completed Course at Unknown time  . meloxicam (MOBIC) 15 MG tablet Take 15 mg by mouth daily.    07/07/2015 at Unknown time  . methocarbamol (ROBAXIN) 500 MG tablet Take 1 tablet (500 mg total) by mouth 2 (two) times daily. (Patient not taking: Reported on 07/08/2015) 20 tablet 0 Completed Course at Unknown time  . Multiple Vitamins-Minerals (MULTIVITAMIN WITH MINERALS) tablet Take 1 tablet by mouth daily.     07/08/2015 at Unknown time   . pantoprazole (PROTONIX) 40 MG tablet Take 40 mg by mouth daily.   07/08/2015 at Unknown time  . Probiotic Product (FLORA-Q PO) Take 1 tablet by mouth daily.     07/08/2015 at Unknown time  . PROCTOFOAM HC rectal foam Place 1 applicator rectally 3 (three) times daily as needed for hemorrhoids.    Past Month at Unknown time  . promethazine (PHENERGAN) 25 MG tablet Take 12.5 mg by mouth every 8 (eight) hours as needed for nausea. Nausea and vomiting   Past Week at Unknown time  . Vitamin Mixture (VITAMIN E COMPLETE) CAPS Take 1 capsule by mouth daily.   07/07/2015 at Unknown time    Review of Systems  Constitutional: Negative for fever.  Gastrointestinal: Positive for abdominal pain. Negative for nausea, vomiting, diarrhea and constipation.  Genitourinary: Negative for dysuria ("pressure in her bladder when voiding"), urgency and frequency.   Physical Exam   Blood pressure 132/94, pulse 85, temperature 98.3 F (36.8 C), temperature source Oral, resp. rate 22, last menstrual period 08/10/2015, SpO2 96 %.  Physical Exam  Nursing note and vitals reviewed. Constitutional: She is oriented to person, place, and time. She appears well-developed and well-nourished. No distress.  HENT:  Head: Normocephalic.  Cardiovascular: Normal rate.   Respiratory: Effort normal.  GI: Soft. There is tenderness.  Neurological: She is alert and oriented to person, place, and time.  Skin: Skin is warm and dry.  Psychiatric: She has a normal mood and affect.   Results for orders placed or performed during the hospital encounter of 08/17/15 (from the past 24 hour(s))  Urinalysis, Routine w reflex microscopic (not at [facility])     Status: Abnormal   Collection Time: 08/17/15  5:10 AM  Result Value Ref Range   Color, Urine YELLOW YELLOW   APPearance CLEAR CLEAR   Specific Gravity, Urine 1.015 1.005 - 1.030   pH 6.5 5.0 - 8.0   Glucose, UA NEGATIVE NEGATIVE mg/dL   Hgb urine dipstick LARGE (A) NEGATIVE   Bilirubin  Urine NEGATIVE NEGATIVE   Ketones, ur NEGATIVE NEGATIVE mg/dL   Protein, ur NEGATIVE NEGATIVE mg/dL   Urobilinogen, UA 0.2 0.0 - 1.0 mg/dL   Nitrite NEGATIVE NEGATIVE   Leukocytes, UA TRACE (A) NEGATIVE  Urine microscopic-add on     Status: None   Collection Time: 08/17/15  5:10 AM  Result Value Ref Range   Squamous Epithelial / LPF RARE RARE   RBC / HPF 21-50 <3 RBC/hpf  Pregnancy, urine POC     Status: None   Collection Time: 08/17/15  5:22 AM  Result Value Ref Range   Preg Test, Ur NEGATIVE NEGATIVE  Wet prep, genital  Status: Abnormal   Collection Time: 08/17/15  5:47 AM  Result Value Ref Range   Yeast Wet Prep HPF POC NONE SEEN NONE SEEN   Trich, Wet Prep NONE SEEN NONE SEEN   Clue Cells Wet Prep HPF POC NONE SEEN NONE SEEN   WBC, Wet Prep HPF POC FEW (A) NONE SEEN  CBC     Status: Abnormal   Collection Time: 08/17/15  6:25 AM  Result Value Ref Range   WBC 6.5 4.0 - 10.5 K/uL   RBC 3.57 (L) 3.87 - 5.11 MIL/uL   Hemoglobin 11.2 (L) 12.0 - 15.0 g/dL   HCT 33.2 (L) 36.0 - 46.0 %   MCV 93.0 78.0 - 100.0 fL   MCH 31.4 26.0 - 34.0 pg   MCHC 33.7 30.0 - 36.0 g/dL   RDW 12.1 11.5 - 15.5 %   Platelets 299 150 - 400 K/uL     MAU Course  Procedures  MDM 0715: Patient reports that pain is better after toradol. She states that the pain is still present, but is now "tolerable".  3570: D/W Dr. Helane Rima, patient can be offered depo-lupron or depo-provera today.  Offered patient depo-provera or lupron today. She declines any new medications at this time.   Assessment and Plan   1. Pelvic pain in female   2. LLQ pain    DC home Comfort measures reviewed  RX: none  Return to MAU as needed FU with OB as planned  Follow-up Information    Follow up with Cyril Mourning, MD.   Specialty:  Obstetrics and Gynecology   Why:  As scheduled   Contact information:   [place] [id number] [phone]       Mathis Bud 08/17/2015,  6:53 AM

## 2015-08-17 NOTE — MAU Note (Signed)
Pt states she has endometriosis. Suppose to see Dr Helane Rima on 08/23/2015.

## 2015-08-17 NOTE — MAU Note (Signed)
LLQ pain that started about an hour ago. Denies vag bleeding. LMP: 08/10/2015.

## 2015-08-18 LAB — URINE CULTURE

## 2015-08-18 LAB — GC/CHLAMYDIA PROBE AMP (~~LOC~~) NOT AT ARMC
CHLAMYDIA, DNA PROBE: NEGATIVE
Neisseria Gonorrhea: NEGATIVE

## 2016-04-04 ENCOUNTER — Ambulatory Visit: Payer: Self-pay | Admitting: Allergy and Immunology

## 2016-09-24 ENCOUNTER — Encounter (HOSPITAL_COMMUNITY): Payer: Self-pay | Admitting: *Deleted

## 2016-09-24 ENCOUNTER — Inpatient Hospital Stay (HOSPITAL_COMMUNITY)
Admission: AD | Admit: 2016-09-24 | Discharge: 2016-09-24 | Disposition: A | Discharge status: Home / Self Care | Payer: BLUE CROSS/BLUE SHIELD | Source: Ambulatory Visit | Attending: Obstetrics & Gynecology | Admitting: Obstetrics & Gynecology

## 2016-09-24 DIAGNOSIS — N809 Endometriosis, unspecified: Secondary | ICD-10-CM | POA: Diagnosis not present

## 2016-09-24 DIAGNOSIS — K219 Gastro-esophageal reflux disease without esophagitis: Secondary | ICD-10-CM | POA: Insufficient documentation

## 2016-09-24 DIAGNOSIS — N926 Irregular menstruation, unspecified: Secondary | ICD-10-CM | POA: Diagnosis not present

## 2016-09-24 DIAGNOSIS — R109 Unspecified abdominal pain: Secondary | ICD-10-CM | POA: Diagnosis present

## 2016-09-24 DIAGNOSIS — N946 Dysmenorrhea, unspecified: Secondary | ICD-10-CM | POA: Diagnosis not present

## 2016-09-24 DIAGNOSIS — J45909 Unspecified asthma, uncomplicated: Secondary | ICD-10-CM | POA: Diagnosis not present

## 2016-09-24 LAB — URINALYSIS, ROUTINE W REFLEX MICROSCOPIC
Bilirubin Urine: NEGATIVE
Glucose, UA: NEGATIVE mg/dL
Ketones, ur: NEGATIVE mg/dL
Nitrite: NEGATIVE
PH: 7 (ref 5.0–8.0)
Protein, ur: NEGATIVE mg/dL

## 2016-09-24 LAB — CBC WITH DIFFERENTIAL/PLATELET
BASOS ABS: 0 10*3/uL (ref 0.0–0.1)
BASOS PCT: 0 %
EOS PCT: 0 %
Eosinophils Absolute: 0 10*3/uL (ref 0.0–0.7)
HCT: 33.5 % — ABNORMAL LOW (ref 36.0–46.0)
Hemoglobin: 11.6 g/dL — ABNORMAL LOW (ref 12.0–15.0)
Lymphocytes Relative: 25 %
Lymphs Abs: 2.2 10*3/uL (ref 0.7–4.0)
MCH: 31.8 pg (ref 26.0–34.0)
MCHC: 34.6 g/dL (ref 30.0–36.0)
MCV: 91.8 fL (ref 78.0–100.0)
MONO ABS: 0.3 10*3/uL (ref 0.1–1.0)
Monocytes Relative: 4 %
NEUTROS ABS: 6.3 10*3/uL (ref 1.7–7.7)
Neutrophils Relative %: 71 %
PLATELETS: 344 10*3/uL (ref 150–400)
RBC: 3.65 MIL/uL — ABNORMAL LOW (ref 3.87–5.11)
RDW: 12.1 % (ref 11.5–15.5)
WBC: 8.9 10*3/uL (ref 4.0–10.5)

## 2016-09-24 LAB — URINE MICROSCOPIC-ADD ON

## 2016-09-24 LAB — ABO/RH: ABO/RH(D): O POS

## 2016-09-24 LAB — WET PREP, GENITAL
Clue Cells Wet Prep HPF POC: NONE SEEN
Sperm: NONE SEEN
Trich, Wet Prep: NONE SEEN
YEAST WET PREP: NONE SEEN

## 2016-09-24 LAB — POCT PREGNANCY, URINE: PREG TEST UR: NEGATIVE

## 2016-09-24 LAB — HCG, QUANTITATIVE, PREGNANCY

## 2016-09-24 MED ORDER — KETOROLAC TROMETHAMINE 60 MG/2ML IM SOLN
60.0000 mg | Freq: Once | INTRAMUSCULAR | Status: AC
  Administered 2016-09-24: 60 mg via INTRAMUSCULAR
  Filled 2016-09-24: qty 2

## 2016-09-24 NOTE — MAU Note (Signed)
Pt hasn't had a period since 9/26, has an appointment with Dr. Helane Rima next week to confirm pregnancy, had pos HPT last week.  Hx of endometriosis, has been cramping for last 2-3 weeks, started spotting Sunday, went to [facility] this, passed very large clot she had to push out.  Has continued to cramp & pass clots.  Feels very weak & nauseated.

## 2016-09-24 NOTE — MAU Provider Note (Signed)
History     CSN: [id number]  Arrival date and time: 09/24/16 1743   First Provider Initiated Contact with Patient 09/24/16 1831      Chief Complaint  Patient presents with  . Abdominal Pain  . Vaginal Bleeding  . Fatigue   HPI [name] is a 35 y.o. [id number] at [redacted]w[redacted]d who presents to MAU today with complaint of vaginal bleeding, lower abdominal pain and possible pregnancy. The patient states LMP 07/30/16. She had +HPT last week. She states abdominal pain first started in the LLQ and now is diffuse and cramping in nature. She rates pain at 7/10 now. She took Percocet earlier today with minimal relief. She states pain is also intermittent sharp shooting pains in her low back. She denies fever. She has had nausea without vomiting, weakness and dizziness. She took Phenergan today as well. She has a history of endometriosis.   OB History    Gravida Para Term Preterm AB Living   4 1 1  0 3 1   SAB TAB Ectopic Multiple Live Births   2 1 0 0        Past Medical History:  Diagnosis Date  . Asthma   . Bronchitis   . Endometriosis   . No pertinent past medical history   . Reflux     Past Surgical History:  Procedure Laterality Date  . laproscopy  2010   cyst removal from ovary  . OOPHORECTOMY      Family History  Problem Relation Age of Onset  . Heart disease Father   . Diabetes Mother     Social History  Substance Use Topics  . Smoking status: Never Smoker  . Smokeless tobacco: Never Used  . Alcohol use No    Allergies:  Allergies  Allergen Reactions  . Chocolate Hives  . Citric Acid Hives  . Doxycycline     Headache, feels all out of sorts  . Grapefruit Extract Hives  . Lemon Juice Hives  . Orange Fruit Hives  . Sulfa Antibiotics Nausea And Vomiting  . Tomato Hives  . Tramadol Rash    headache    Prescriptions Prior to Admission  Medication Sig Dispense Refill Last Dose  . acetaminophen (TYLENOL) 500 MG tablet Take 1,000 mg by mouth every 4  (four) hours as needed for moderate pain. For headache, side pain   09/24/2016 at Unknown time  . ALPRAZolam (XANAX) 1 MG tablet Take 1 mg by mouth at bedtime as needed for anxiety.   Past Month at Unknown time  . Ascorbic Acid (VITAMIN C PO) Take 1 tablet by mouth daily.   09/24/2016 at Unknown time  . B Complex-C (B-COMPLEX WITH VITAMIN C) tablet Take 1 tablet by mouth daily.   09/24/2016 at Unknown time  . Hyprom-Naphaz-Polysorb-Zn Sulf (CLEAR EYES COMPLETE OP) Apply 2 drops to eye daily as needed (for irritation/moisture).   Past Week at Unknown time  . mirabegron ER (MYRBETRIQ) 25 MG TB24 tablet Take 25 mg by mouth daily.   09/24/2016 at Unknown time  . Multiple Vitamins-Minerals (MULTIVITAMIN WITH MINERALS) tablet Take 1 tablet by mouth daily.     09/24/2016 at Unknown time  . OVER THE COUNTER MEDICATION Take 1 tablet by mouth daily. Monique Hughes liver oil, vit E, vit D   09/24/2016 at Unknown time  . oxyCODONE-acetaminophen (PERCOCET) 10-325 MG per tablet 1 tablet every 6 (six) hours as needed for pain.    09/24/2016 at Unknown time  . pantoprazole (PROTONIX) 40 MG  tablet Take 40 mg by mouth daily.   09/24/2016 at Unknown time  . Probiotic Product (FLORA-Q PO) Take 1 tablet by mouth daily.     09/24/2016 at Unknown time  . promethazine (PHENERGAN) 25 MG tablet Take 12.5 mg by mouth every 8 (eight) hours as needed for nausea. Nausea and vomiting   09/24/2016 at Unknown time  . albuterol (PROVENTIL HFA;VENTOLIN HFA) 108 (90 BASE) MCG/ACT inhaler Inhale into the lungs every 6 (six) hours as needed for wheezing or shortness of breath.   Rescue    Review of Systems  Constitutional: Negative for fever and malaise/fatigue.  Gastrointestinal: Positive for abdominal pain and nausea. Negative for constipation, diarrhea and vomiting.  Genitourinary: Negative for dysuria, frequency and urgency.       + vaginal bleeding with clots  Neurological: Positive for dizziness and weakness.   Physical Exam   Blood  pressure 102/65, pulse 91, temperature 98.6 F (37 C), temperature source Oral, resp. rate 16, height 5\' 4"  (1.626 m), weight 148 lb (67.1 kg), last menstrual period 07/30/2016.  Physical Exam  Nursing note and vitals reviewed. Constitutional: She is oriented to person, place, and time. She appears well-developed and well-nourished. No distress.  HENT:  Head: Normocephalic and atraumatic.  Cardiovascular: Normal rate.   Respiratory: Effort normal.  GI: Soft. She exhibits no distension and no mass. There is tenderness (diffuse abdominal tenderness to palpation). There is no rebound and no guarding.  Genitourinary:  Genitourinary Comments: Scant blood on pad  Neurological: She is alert and oriented to person, place, and time.  Skin: Skin is warm and dry. No erythema.  Psychiatric: She has a normal mood and affect.    Results for orders placed or performed during the hospital encounter of 09/24/16 (from the past 24 hour(s))  Urinalysis, Routine w reflex microscopic (not at [facility])     Status: Abnormal   Collection Time: 09/24/16  6:14 PM  Result Value Ref Range   Color, Urine YELLOW YELLOW   APPearance CLEAR CLEAR   Specific Gravity, Urine <1.005 (L) 1.005 - 1.030   pH 7.0 5.0 - 8.0   Glucose, UA NEGATIVE NEGATIVE mg/dL   Hgb urine dipstick LARGE (A) NEGATIVE   Bilirubin Urine NEGATIVE NEGATIVE   Ketones, ur NEGATIVE NEGATIVE mg/dL   Protein, ur NEGATIVE NEGATIVE mg/dL   Nitrite NEGATIVE NEGATIVE   Leukocytes, UA TRACE (A) NEGATIVE  Urine microscopic-add on     Status: Abnormal   Collection Time: 09/24/16  6:14 PM  Result Value Ref Range   Squamous Epithelial / LPF 0-5 (A) NONE SEEN   WBC, UA 0-5 0 - 5 WBC/hpf   RBC / HPF 0-5 0 - 5 RBC/hpf   Bacteria, UA FEW (A) NONE SEEN  Pregnancy, urine POC     Status: None   Collection Time: 09/24/16  6:20 PM  Result Value Ref Range   Preg Test, Ur NEGATIVE NEGATIVE  Wet prep, genital     Status: Abnormal   Collection Time: 09/24/16  7:00  PM  Result Value Ref Range   Yeast Wet Prep HPF POC NONE SEEN NONE SEEN   Trich, Wet Prep NONE SEEN NONE SEEN   Clue Cells Wet Prep HPF POC NONE SEEN NONE SEEN   WBC, Wet Prep HPF POC MODERATE (A) NONE SEEN   Sperm NONE SEEN   CBC with Differential/Platelet     Status: Abnormal   Collection Time: 09/24/16  7:25 PM  Result Value Ref Range   WBC 8.9  4.0 - 10.5 K/uL   RBC 3.65 (L) 3.87 - 5.11 MIL/uL   Hemoglobin 11.6 (L) 12.0 - 15.0 g/dL   HCT 33.5 (L) 36.0 - 46.0 %   MCV 91.8 78.0 - 100.0 fL   MCH 31.8 26.0 - 34.0 pg   MCHC 34.6 30.0 - 36.0 g/dL   RDW 12.1 11.5 - 15.5 %   Platelets 344 150 - 400 K/uL   Neutrophils Relative % 71 %   Neutro Abs 6.3 1.7 - 7.7 K/uL   Lymphocytes Relative 25 %   Lymphs Abs 2.2 0.7 - 4.0 K/uL   Monocytes Relative 4 %   Monocytes Absolute 0.3 0.1 - 1.0 K/uL   Eosinophils Relative 0 %   Eosinophils Absolute 0.0 0.0 - 0.7 K/uL   Basophils Relative 0 %   Basophils Absolute 0.0 0.0 - 0.1 K/uL  ABO/Rh     Status: None   Collection Time: 09/24/16  7:25 PM  Result Value Ref Range   ABO/RH(D) O POS   hCG, quantitative, pregnancy     Status: None   Collection Time: 09/24/16  7:25 PM  Result Value Ref Range   hCG, Beta Chain, Quant, S <1 <5 mIU/mL    MAU Course  Procedures None  MDM UPT - negative UA, wet prep, GC/Chlamydia, CBC, hCG, ABO/Rh, HIV and RPR today  Discussed patient with Dr. Lynnette Caffey. Agrees with plan for Toradol IM today and discharge to follow-up in the office as scheduled or sooner if symptoms persist.   Assessment and Plan  A: Endometriosis Dysmenorrhea Irregular menses  P: Discharge home Continue previously prescribed pain medication as directed Bleeding precautions discussed Patient advised to follow-up with Dr. Helane Rima as scheduled or sooner if symptoms persist or worsen Patient may return to MAU as needed or if her condition were to change or worsen   Luvenia Redden, PA-C  09/24/2016, 8:39 PM

## 2016-09-24 NOTE — Discharge Instructions (Signed)
Endometriosis Endometriosis is a condition in which the tissue that lines the uterus (endometrium) grows outside of its normal location. The tissue may grow in many locations close to the uterus, but it commonly grows on the ovaries, fallopian tubes, vagina, or bowel. When the uterus sheds the endometrium every menstrual cycle, there is bleeding wherever the endometrial tissue is located. This can cause pain because blood is irritating to tissues that are not normally exposed to it. What are the causes? The cause of endometriosis is not known. What increases the risk? You may be more likely to develop endometriosis if you:  Have a family history of endometriosis.  Have never given birth.  Started your period at age 10 or younger.  Have high levels of estrogen in your body.  Were exposed to a certain medicine (diethylstilbestrol) before you were born (in utero).  Had low birth weight.  Were born as a twin, triplet, or other multiple.  Have a BMI of less than 25. BMI is an estimate of body fat and is calculated from height and weight. What are the signs or symptoms? Often, there are no symptoms of this condition. If you do have symptoms, they may:  Vary depending on where your endometrial tissue is growing.  Occur during your menstrual period (most common) or midcycle.  Come and go, or you may go months with no symptoms at all.  Stop with menopause. Symptoms may include:  Pain in the back or abdomen.  Heavier bleeding during periods.  Pain during sex.  Painful bowel movements.  Infertility.  Pelvic pain.  Bleeding more than once a month. How is this diagnosed? This condition is diagnosed based on your symptoms and a physical exam. You may have tests, such as:  Blood tests and urine tests. These may be done to help rule out other possible causes of your symptoms.  Ultrasound, to look for abnormal tissues.  An X-ray of the lower bowel (barium enema).  An ultrasound  that is done through the vagina (transvaginally).  CT scan.  MRI.  Laparoscopy. In this procedure, a lighted, pencil-sized instrument called a laparoscope is inserted into your abdomen through an incision. The laparoscope allows your health care provider to look at the organs inside your body and check for abnormal tissue to confirm the diagnosis. If abnormal tissue is found, your health care provider may remove a small piece of tissue (biopsy) to be examined under a microscope. How is this treated? Treatment for this condition may include:  Medicines to relieve pain, such as NSAIDs.  Hormone therapy. This involves using artificial (synthetic) hormones to reduce endometrial tissue growth. Your health care provider may recommend using a hormonal form of birth control, or other medicines.  Surgery. This may be done to remove abnormal endometrial tissue.  In some cases, tissue may be removed using a laparoscope and a laser (laparoscopic laser treatment).  In severe cases, surgery may be done to remove the fallopian tubes, uterus, and ovaries (hysterectomy). Follow these instructions at home:  Take over-the-counter and prescription medicines only as told by your health care provider.  Do not drive or use heavy machinery while taking prescription pain medicine.  Try to avoid activities that cause pain, including sexual activity.  Keep all follow-up visits as told by your health care provider. This is important. Contact a health care provider if:  You have pain in the area between your hip bones (pelvic area) that occurs:  Before, during, or after your period.  In   between your period and gets worse during your period.  During or after sex.  With bowel movements or urination, especially during your period.  You have problems getting pregnant.  You have a fever. Get help right away if:  You have severe pain that does not get better with medicine.  You have severe nausea and  vomiting, or you cannot eat without vomiting.  You have pain that affects only the lower, right side of your abdomen.  You have abdominal pain that gets worse.  You have abdominal swelling.  You have blood in your stool. This information is not intended to replace advice given to you by your health care provider. Make sure you discuss any questions you have with your health care provider. Document Released: 10/18/2000 Document Revised: 07/26/2016 Document Reviewed: 03/23/2016 Elsevier Interactive Patient Education  2017 [employer]  

## 2016-09-25 LAB — GC/CHLAMYDIA PROBE AMP (~~LOC~~) NOT AT ARMC
Chlamydia: NEGATIVE
Neisseria Gonorrhea: NEGATIVE

## 2016-09-25 LAB — RPR: RPR: NONREACTIVE

## 2016-09-25 LAB — HIV ANTIBODY (ROUTINE TESTING W REFLEX): HIV SCREEN 4TH GENERATION: NONREACTIVE

## 2016-11-27 ENCOUNTER — Encounter (HOSPITAL_COMMUNITY): Payer: Self-pay | Admitting: *Deleted

## 2016-11-27 ENCOUNTER — Inpatient Hospital Stay (HOSPITAL_COMMUNITY)
Admission: AD | Admit: 2016-11-27 | Discharge: 2016-11-27 | Disposition: A | Discharge status: Home / Self Care | Payer: BLUE CROSS/BLUE SHIELD | Source: Ambulatory Visit | Attending: Obstetrics and Gynecology | Admitting: Obstetrics and Gynecology

## 2016-11-27 DIAGNOSIS — R102 Pelvic and perineal pain: Secondary | ICD-10-CM | POA: Diagnosis not present

## 2016-11-27 DIAGNOSIS — Z8742 Personal history of other diseases of the female genital tract: Secondary | ICD-10-CM

## 2016-11-27 DIAGNOSIS — G8929 Other chronic pain: Secondary | ICD-10-CM

## 2016-11-27 DIAGNOSIS — Z3202 Encounter for pregnancy test, result negative: Secondary | ICD-10-CM | POA: Insufficient documentation

## 2016-11-27 DIAGNOSIS — R1030 Lower abdominal pain, unspecified: Secondary | ICD-10-CM | POA: Diagnosis present

## 2016-11-27 LAB — URINALYSIS, ROUTINE W REFLEX MICROSCOPIC
BILIRUBIN URINE: NEGATIVE
GLUCOSE, UA: NEGATIVE mg/dL
Ketones, ur: NEGATIVE mg/dL
LEUKOCYTES UA: NEGATIVE
Nitrite: NEGATIVE
PROTEIN: NEGATIVE mg/dL
SPECIFIC GRAVITY, URINE: 1.013 (ref 1.005–1.030)
pH: 5 (ref 5.0–8.0)

## 2016-11-27 LAB — CBC
HEMATOCRIT: 33.9 % — AB (ref 36.0–46.0)
HEMOGLOBIN: 11.4 g/dL — AB (ref 12.0–15.0)
MCH: 31.6 pg (ref 26.0–34.0)
MCHC: 33.6 g/dL (ref 30.0–36.0)
MCV: 93.9 fL (ref 78.0–100.0)
Platelets: 328 10*3/uL (ref 150–400)
RBC: 3.61 MIL/uL — ABNORMAL LOW (ref 3.87–5.11)
RDW: 12.5 % (ref 11.5–15.5)
WBC: 8.9 10*3/uL (ref 4.0–10.5)

## 2016-11-27 LAB — WET PREP, GENITAL
Clue Cells Wet Prep HPF POC: NONE SEEN
SPERM: NONE SEEN
Trich, Wet Prep: NONE SEEN
Yeast Wet Prep HPF POC: NONE SEEN

## 2016-11-27 LAB — POCT PREGNANCY, URINE: Preg Test, Ur: NEGATIVE

## 2016-11-27 LAB — GC/CHLAMYDIA PROBE AMP (~~LOC~~) NOT AT ARMC
CHLAMYDIA, DNA PROBE: NEGATIVE
Neisseria Gonorrhea: NEGATIVE

## 2016-11-27 MED ORDER — ONDANSETRON 8 MG PO TBDP
8.0000 mg | ORAL_TABLET | Freq: Once | ORAL | Status: AC
  Administered 2016-11-27: 8 mg via ORAL
  Filled 2016-11-27: qty 1

## 2016-11-27 MED ORDER — KETOROLAC TROMETHAMINE 30 MG/ML IJ SOLN
30.0000 mg | Freq: Once | INTRAMUSCULAR | Status: AC
  Administered 2016-11-27: 30 mg via INTRAMUSCULAR
  Filled 2016-11-27: qty 1

## 2016-11-27 NOTE — MAU Note (Signed)
Arrival via EMS with complaint of lower abd pain and nausea which she states is due to endometriosis.

## 2016-11-27 NOTE — MAU Provider Note (Signed)
History     CSN: [id number]  Arrival date and time: 11/27/16 [id number]   First Provider Initiated Contact with Patient 11/27/16 0259      No chief complaint on file.  Non-pregnant female here with nausea, weakness, and lower abdominal pain. She reports sx starting last week but worsened in last 24 hrs. She is not vomiting. She used Phenergan 4 hrs ago, has "a little" nausea now. She started spotting yesterday. She is due for menses. She reports these sx are common near the time of menses. She has known hx of endometriosis. She is not using the Percocet prescribed to her due to nausea. She denies sick contacts. LMP 10/26/16, she reports 2 menses last month. No new partner in 1 year.    Pertinent Gynecological History:  Contraception: condoms Sexually transmitted diseases: no past history Previous GYN Procedures: ? myomectomy, laser laparoscopy for endo    Past Medical History:  Diagnosis Date  . Asthma   . Bronchitis   . Endometriosis   . No pertinent past medical history   . Reflux     Past Surgical History:  Procedure Laterality Date  . laproscopy  2010   cyst removal from ovary  . OOPHORECTOMY      Family History  Problem Relation Age of Onset  . Heart disease Father   . Diabetes Mother     Social History  Substance Use Topics  . Smoking status: Never Smoker  . Smokeless tobacco: Never Used  . Alcohol use No    Allergies:  Allergies  Allergen Reactions  . Chocolate Hives  . Citric Acid Hives  . Doxycycline     Headache, feels all out of sorts  . Grapefruit Extract Hives  . Lemon Juice Hives  . Orange Fruit Hives  . Sulfa Antibiotics Nausea And Vomiting  . Tomato Hives  . Tramadol Rash    headache    Prescriptions Prior to Admission  Medication Sig Dispense Refill Last Dose  . acetaminophen (TYLENOL) 500 MG tablet Take 1,000 mg by mouth every 4 (four) hours as needed for moderate pain. For headache, side pain   11/26/2016 at 1900  . ALPRAZolam (XANAX)  1 MG tablet Take 1 mg by mouth at bedtime as needed for anxiety.   Past Week at Unknown time  . Ascorbic Acid (VITAMIN C PO) Take 1 tablet by mouth daily.   11/26/2016 at Unknown time  . B Complex-C (B-COMPLEX WITH VITAMIN C) tablet Take 1 tablet by mouth daily.   11/26/2016 at Unknown time  . mirabegron ER (MYRBETRIQ) 25 MG TB24 tablet Take 25 mg by mouth daily.   11/26/2016 at Unknown time  . Multiple Vitamins-Minerals (MULTIVITAMIN WITH MINERALS) tablet Take 1 tablet by mouth daily.     11/26/2016 at Unknown time  . oxyCODONE-acetaminophen (PERCOCET) 10-325 MG per tablet 1 tablet every 6 (six) hours as needed for pain.    11/24/2016 at Unknown time  . pantoprazole (PROTONIX) 40 MG tablet Take 40 mg by mouth daily.   11/26/2016 at Unknown time  . Probiotic Product (FLORA-Q PO) Take 1 tablet by mouth daily.     11/26/2016 at Unknown time  . albuterol (PROVENTIL HFA;VENTOLIN HFA) 108 (90 BASE) MCG/ACT inhaler Inhale into the lungs every 6 (six) hours as needed for wheezing or shortness of breath.   Rescue  . Hyprom-Naphaz-Polysorb-Zn Sulf (CLEAR EYES COMPLETE OP) Apply 2 drops to eye daily as needed (for irritation/moisture).   Past Week at Unknown time  . OVER THE  COUNTER MEDICATION Take 1 tablet by mouth daily. Marissa liver oil, vit E, vit D   09/24/2016 at Unknown time  . promethazine (PHENERGAN) 25 MG tablet Take 12.5 mg by mouth every 8 (eight) hours as needed for nausea. Nausea and vomiting   11/26/2016 at 2300    Review of Systems  Constitutional: Positive for chills. Negative for fever.  Gastrointestinal: Positive for diarrhea and nausea.  Genitourinary: Positive for vaginal bleeding. Negative for dysuria and vaginal discharge.   Physical Exam   Blood pressure 132/80, pulse 119, temperature 98.5 F (36.9 C), temperature source Oral, resp. rate 18, last menstrual period 11/27/2016, SpO2 98 %.  Physical Exam  Constitutional: She is oriented to person, place, and time. She appears well-developed  and well-nourished. No distress (appears comfortable lying in bed).  HENT:  Head: Normocephalic and atraumatic.  Neck: Normal range of motion.  Cardiovascular: Normal rate.   Respiratory: Effort normal.  GI: Soft. She exhibits no distension and no mass. There is no tenderness. There is no rebound and no guarding.  Genitourinary:  Genitourinary Comments: External: no lesions or erythema Vagina: rugated, parous, thick brown discharge Uterus: non enlarged, anteverted, non tender, ++ CMT Adnexae: no masses, no tenderness left, no tenderness right   Musculoskeletal: Normal range of motion.  Neurological: She is alert and oriented to person, place, and time.  Skin: Skin is warm and dry.  Psychiatric: She has a normal mood and affect.   Results for orders placed or performed during the hospital encounter of 11/27/16 (from the past 24 hour(s))  Urinalysis, Routine w reflex microscopic     Status: Abnormal   Collection Time: 11/27/16  2:33 AM  Result Value Ref Range   Color, Urine YELLOW YELLOW   APPearance CLEAR CLEAR   Specific Gravity, Urine 1.013 1.005 - 1.030   pH 5.0 5.0 - 8.0   Glucose, UA NEGATIVE NEGATIVE mg/dL   Hgb urine dipstick MODERATE (A) NEGATIVE   Bilirubin Urine NEGATIVE NEGATIVE   Ketones, ur NEGATIVE NEGATIVE mg/dL   Protein, ur NEGATIVE NEGATIVE mg/dL   Nitrite NEGATIVE NEGATIVE   Leukocytes, UA NEGATIVE NEGATIVE   RBC / HPF 0-5 0 - 5 RBC/hpf   WBC, UA 0-5 0 - 5 WBC/hpf   Bacteria, UA RARE (A) NONE SEEN   Squamous Epithelial / LPF 0-5 (A) NONE SEEN   Mucous PRESENT   Pregnancy, urine POC     Status: None   Collection Time: 11/27/16  2:36 AM  Result Value Ref Range   Preg Test, Ur NEGATIVE NEGATIVE  Wet prep, genital     Status: Abnormal   Collection Time: 11/27/16  3:13 AM  Result Value Ref Range   Yeast Wet Prep HPF POC NONE SEEN NONE SEEN   Trich, Wet Prep NONE SEEN NONE SEEN   Clue Cells Wet Prep HPF POC NONE SEEN NONE SEEN   WBC, Wet Prep HPF POC  MODERATE (A) NONE SEEN   Sperm NONE SEEN   CBC     Status: Abnormal   Collection Time: 11/27/16  3:26 AM  Result Value Ref Range   WBC 8.9 4.0 - 10.5 K/uL   RBC 3.61 (L) 3.87 - 5.11 MIL/uL   Hemoglobin 11.4 (L) 12.0 - 15.0 g/dL   HCT 33.9 (L) 36.0 - 46.0 %   MCV 93.9 78.0 - 100.0 fL   MCH 31.6 26.0 - 34.0 pg   MCHC 33.6 30.0 - 36.0 g/dL   RDW 12.5 11.5 - 15.5 %  Platelets 328 150 - 400 K/uL   MAU Course  Procedures Toradol 30 IM Zofran 8 ODT  MDM Labs ordered and reviewed. No evidence of acute abdominal or pelvic process. Pain chronic in nature. Pain and nausea reported improved after meds. Presentation, clinical findings, and plan discussed with Dr. Matthew Saras. Stable for discharge home.  Assessment and Plan   1. Chronic pelvic pain in female   2. History of endometriosis    Discharge home Follow up in office with Dr. Helane Rima in 1-2 weeks  Allergies as of 11/27/2016      Reactions   Chocolate Hives   Citric Acid Hives   Doxycycline    Headache, feels all out of sorts   Grapefruit Extract Hives   Lemon Juice Hives   Orange Fruit Hives   Sulfa Antibiotics Nausea And Vomiting   Tomato Hives   Tramadol Rash   headache      Medication List    TAKE these medications   acetaminophen 500 MG tablet Commonly known as:  TYLENOL Take 1,000 mg by mouth every 4 (four) hours as needed for moderate pain. For headache, side pain   albuterol 108 (90 Base) MCG/ACT inhaler Commonly known as:  PROVENTIL HFA;VENTOLIN HFA Inhale into the lungs every 6 (six) hours as needed for wheezing or shortness of breath.   ALPRAZolam 1 MG tablet Commonly known as:  XANAX Take 1 mg by mouth at bedtime as needed for anxiety.   B-complex with vitamin C tablet Take 1 tablet by mouth daily.   CLEAR EYES COMPLETE OP Apply 2 drops to eye daily as needed (for irritation/moisture).   FLORA-Q PO Take 1 tablet by mouth daily.   multivitamin with minerals tablet Take 1 tablet by mouth daily.    MYRBETRIQ 25 MG Tb24 tablet Generic drug:  mirabegron ER Take 25 mg by mouth daily.   OVER THE COUNTER MEDICATION Take 1 tablet by mouth daily. Cod liver oil, vit E, vit D   oxyCODONE-acetaminophen 10-325 MG tablet Commonly known as:  PERCOCET 1 tablet every 6 (six) hours as needed for pain.   pantoprazole 40 MG tablet Commonly known as:  PROTONIX Take 40 mg by mouth daily.   promethazine 25 MG tablet Commonly known as:  PHENERGAN Take 12.5 mg by mouth every 8 (eight) hours as needed for nausea. Nausea and vomiting   VITAMIN C PO Take 1 tablet by mouth daily.      Julianne Handler, CNM 11/27/2016, 3:01 AM

## 2016-11-27 NOTE — Discharge Instructions (Signed)
Pelvic Pain, Female °Female pelvic pain can be caused by many different things and start from a variety of places. Pelvic pain refers to pain that is located in the lower half of the abdomen and between your hips. The pain may occur over a short period of time (acute) or may be reoccurring (chronic). The cause of pelvic pain may be related to disorders affecting the female reproductive organs (gynecologic), but it may also be related to the bladder, kidney stones, an intestinal complication, or muscle or skeletal problems. Getting help right away for pelvic pain is important, especially if there has been severe, sharp, or a sudden onset of unusual pain. It is also important to get help right away because some types of pelvic pain can be life threatening.  °CAUSES  °Below are only some of the causes of pelvic pain. The causes of pelvic pain can be in one of several categories.  °· Gynecologic. °¨ Pelvic inflammatory disease. °¨ Sexually transmitted infection. °¨ Ovarian cyst or a twisted ovarian ligament (ovarian torsion). °¨ Uterine lining that grows outside the uterus (endometriosis). °¨ Fibroids, cysts, or tumors. °¨ Ovulation. °· Pregnancy. °¨ Pregnancy that occurs outside the uterus (ectopic pregnancy). °¨ Miscarriage. °¨ Labor. °¨ Abruption of the placenta or ruptured uterus. °· Infection. °¨ Uterine infection (endometritis). °¨ Bladder infection. °¨ Diverticulitis. °¨ Miscarriage related to a uterine infection (septic abortion). °· Bladder. °¨ Inflammation of the bladder (cystitis). °¨ Kidney stone(s). °· Gastrointestinal. °¨ Constipation. °¨ Diverticulitis. °· Neurologic. °¨ Trauma. °¨ Feeling pelvic pain because of mental or emotional causes (psychosomatic). °· Cancers of the bowel or pelvis. °EVALUATION  °Your caregiver will want to take a careful history of your concerns. This includes recent changes in your health, a careful gynecologic history of your periods (menses), and a sexual history. Obtaining  your family history and medical history is also important. Your caregiver may suggest a pelvic exam. A pelvic exam will help identify the location and severity of the pain. It also helps in the evaluation of which organ system may be involved. In order to identify the cause of the pelvic pain and be properly treated, your caregiver may order tests. These tests may include:  °· A pregnancy test. °· Pelvic ultrasonography. °· An X-ray exam of the abdomen. °· A urinalysis or evaluation of vaginal discharge. °· Blood tests. °HOME CARE INSTRUCTIONS  °· Only take over-the-counter or prescription medicines for pain, discomfort, or fever as directed by your caregiver.   °· Rest as directed by your caregiver.   °· Eat a balanced diet.   °· Drink enough fluids to make your urine clear or pale yellow, or as directed.   °· Avoid sexual intercourse if it causes pain.   °· Apply warm or cold compresses to the lower abdomen depending on which one helps the pain.   °· Avoid stressful situations.   °· Keep a journal of your pelvic pain. Write down when it started, where the pain is located, and if there are things that seem to be associated with the pain, such as food or your menstrual cycle. °· Follow up with your caregiver as directed.   °SEEK MEDICAL CARE IF: °· Your medicine does not help your pain. °· You have abnormal vaginal discharge. °SEEK IMMEDIATE MEDICAL CARE IF:  °· You have heavy bleeding from the vagina.   °· Your pelvic pain increases.   °· You feel light-headed or faint.   °· You have chills.   °· You have pain with urination or blood in your urine.   °· You have uncontrolled   diarrhea or vomiting.   You have a fever or persistent symptoms for more than 3 days.  You have a fever and your symptoms suddenly get worse.   You are being physically or sexually abused. This information is not intended to replace advice given to you by your health care provider. Make sure you discuss any questions you have with your  health care provider. Document Released: 09/17/2004 Document Revised: 07/12/2015 Document Reviewed: 08/11/2015 Elsevier Interactive Patient Education  2017 [employer].

## 2017-06-12 IMAGING — CT CT ANGIO CHEST
2 of 6 series · 18 of 36 positions shown · IV contrast (OMNIPAQUE 350)
Comparison: Chest x-ray obtained earlier today; prior CT scan of
the chest 10/08/2006

CLINICAL DATA: 33-year-old female with chest pain and elevated
D-dimer

EXAM:
CT ANGIOGRAPHY CHEST WITH CONTRAST
TECHNIQUE: Multidetector CT imaging of the chest was performed using the
standard protocol during bolus administration of intravenous
contrast. Multiplanar CT image reconstructions and MIPs were
obtained to evaluate the vascular anatomy.
CONTRAST:  100mL OMNIPAQUE IOHEXOL 350 MG/ML SOLN

[Series 7: coronal mpr · coronal · 0.45mm/px · 1 of 150 slices shown]
[im 75/150  mediastinal]
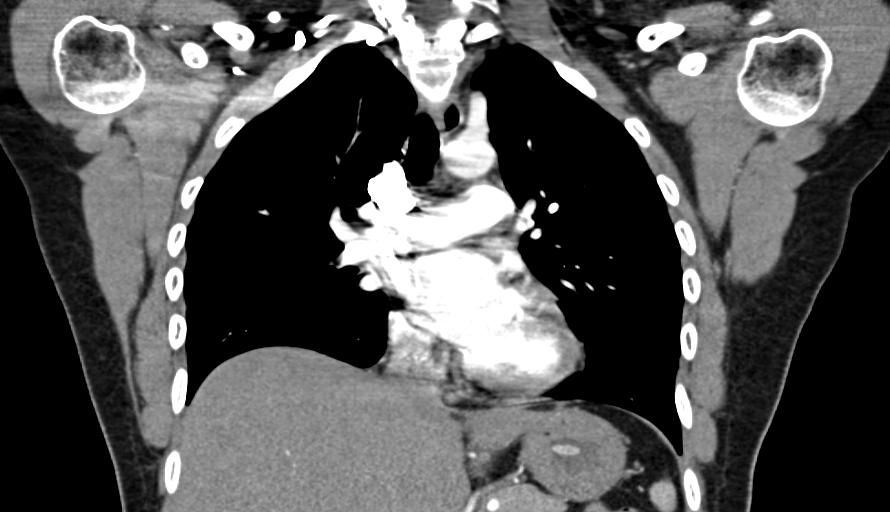

[Series 12: thins for pacs · axial · 0.74mm/px · z∈[+1385,+1591]mm · 17 of 230 slices shown]
[im 12/230  lung]
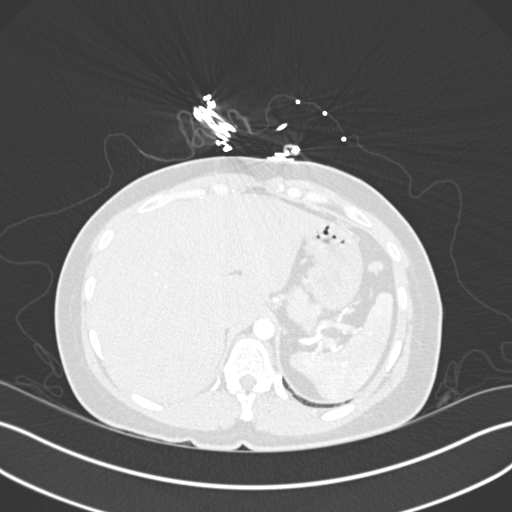
[im 23/230  mediastinal]
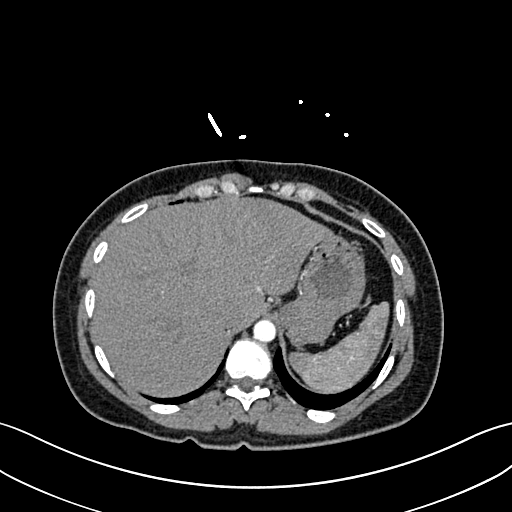
[im 35/230  lung]
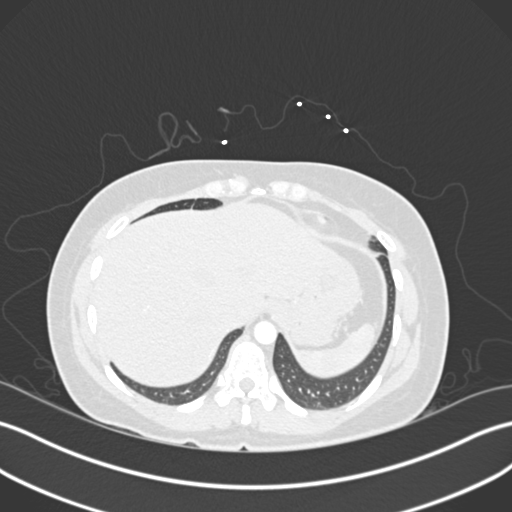
[im 46/230  mediastinal]
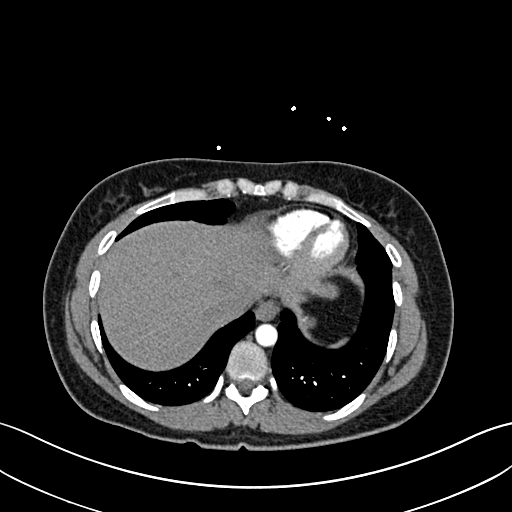
[im 69/230  lung]
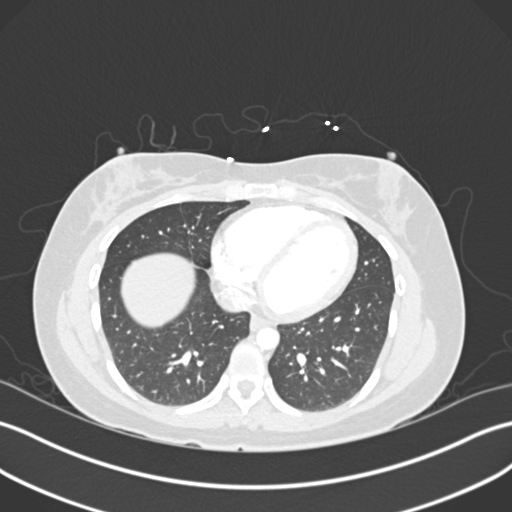
[im 81/230  mediastinal]
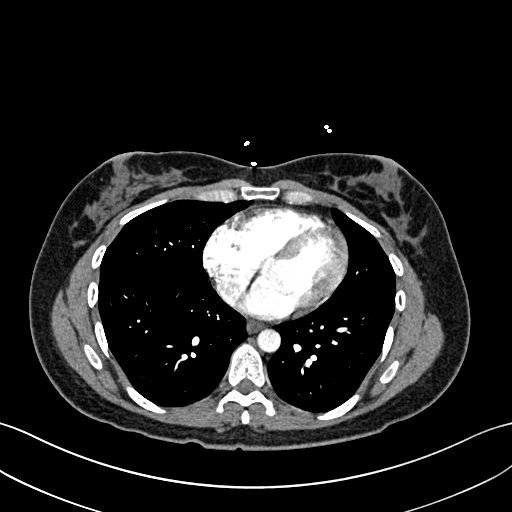
[im 92/230  lung]
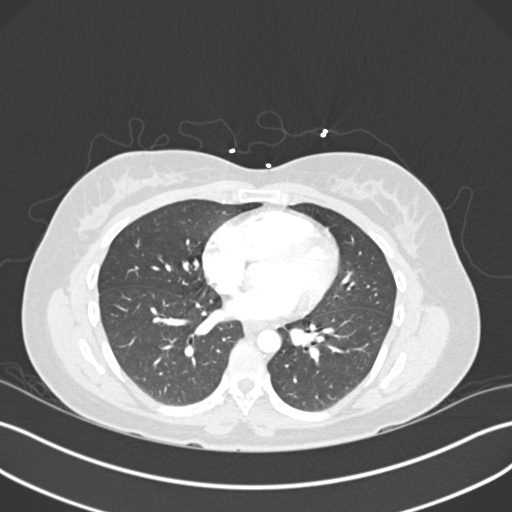
[im 104/230  mediastinal]
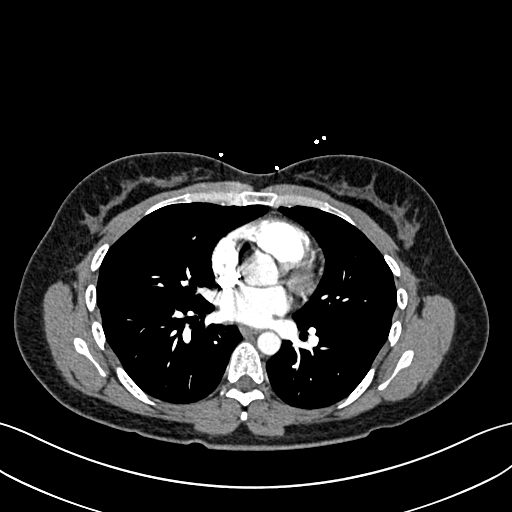
[im 115/230  lung]
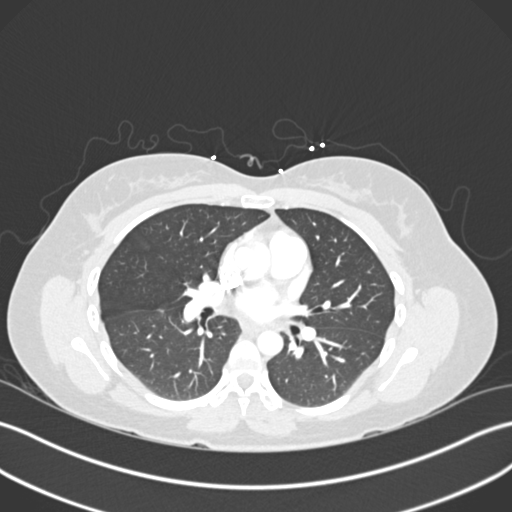
[im 126/230  mediastinal]
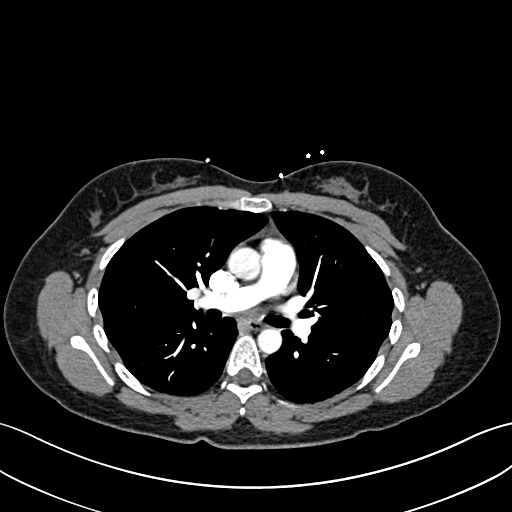
[im 138/230  lung]
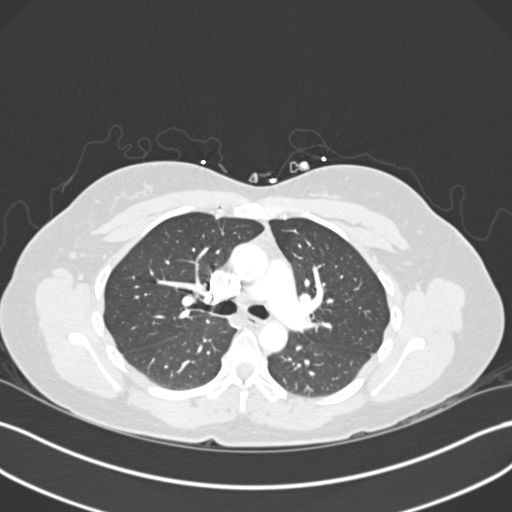
[im 149/230  mediastinal]
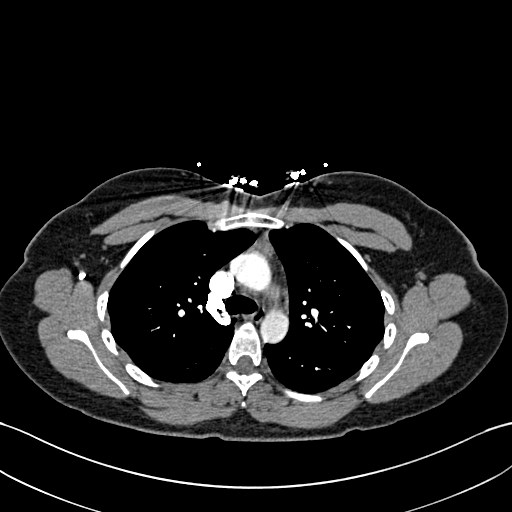
[im 161/230  lung]
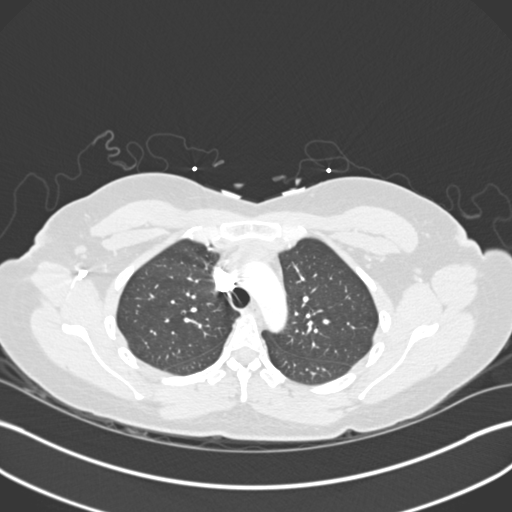
[im 184/230  mediastinal]
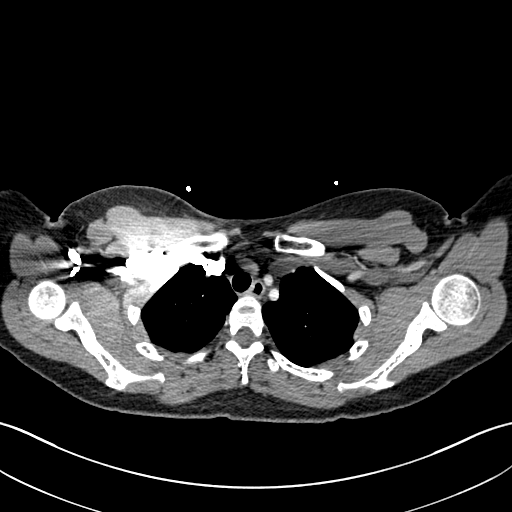
[im 195/230  lung]
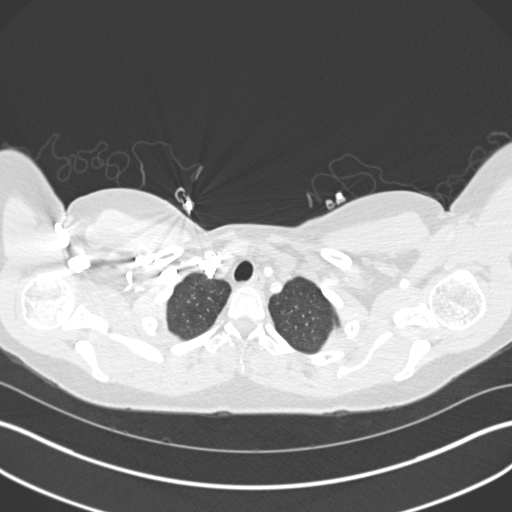
[im 207/230  mediastinal]
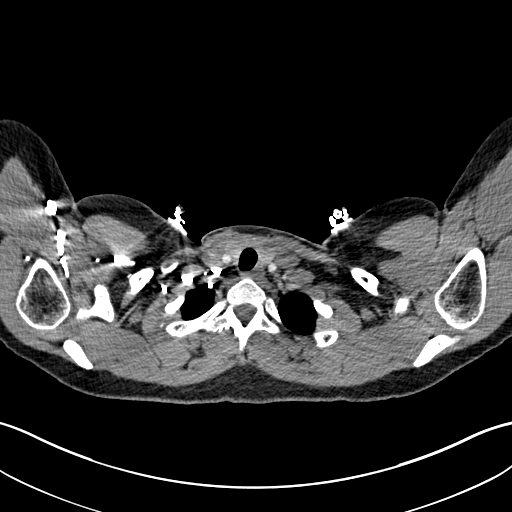
[im 218/230  lung]
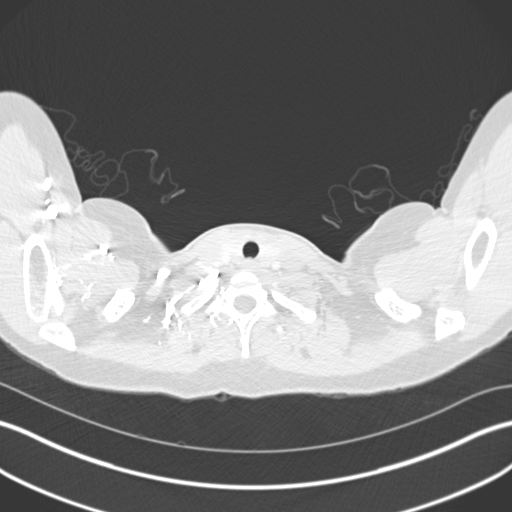

[18 of 36 positions shown; findings below may reference images not displayed]

FINDINGS: Mediastinum: Unremarkable CT appearance of the thyroid gland. No
suspicious mediastinal or hilar adenopathy. Strandy triangular soft
tissue in the anterior mediastinum consistent with residual thymus.
The thoracic esophagus is unremarkable.

Heart/Vascular: Adequate opacification of the pulmonary arteries to
the proximal subsegmental level. There is no evidence of central
filling defect to suggest acute pulmonary embolus. Conventional 3
vessel aortic arch anatomy. No evidence of aneurysm. The heart
appears within normal limits in size. No pericardial effusion.

Lungs/Pleura: The lungs are clear.

Bones/Soft Tissues: No acute fracture or aggressive appearing lytic
or blastic osseous lesion.

Upper Abdomen: Visualized upper abdominal organs are unremarkable.

Review of the MIP images confirms the above findings.
IMPRESSION: No evidence of acute pulmonary embolus, pneumonia or other acute
cardiopulmonary process.

## 2017-06-12 IMAGING — CR DG CHEST 2V
2 series · 2 of 2 positions shown · non-contrast
Comparison: 07/24/2011

CLINICAL DATA: Chest pain, dizziness for 3 weeks

EXAM:
CHEST  2 VIEW

[w chest pa]
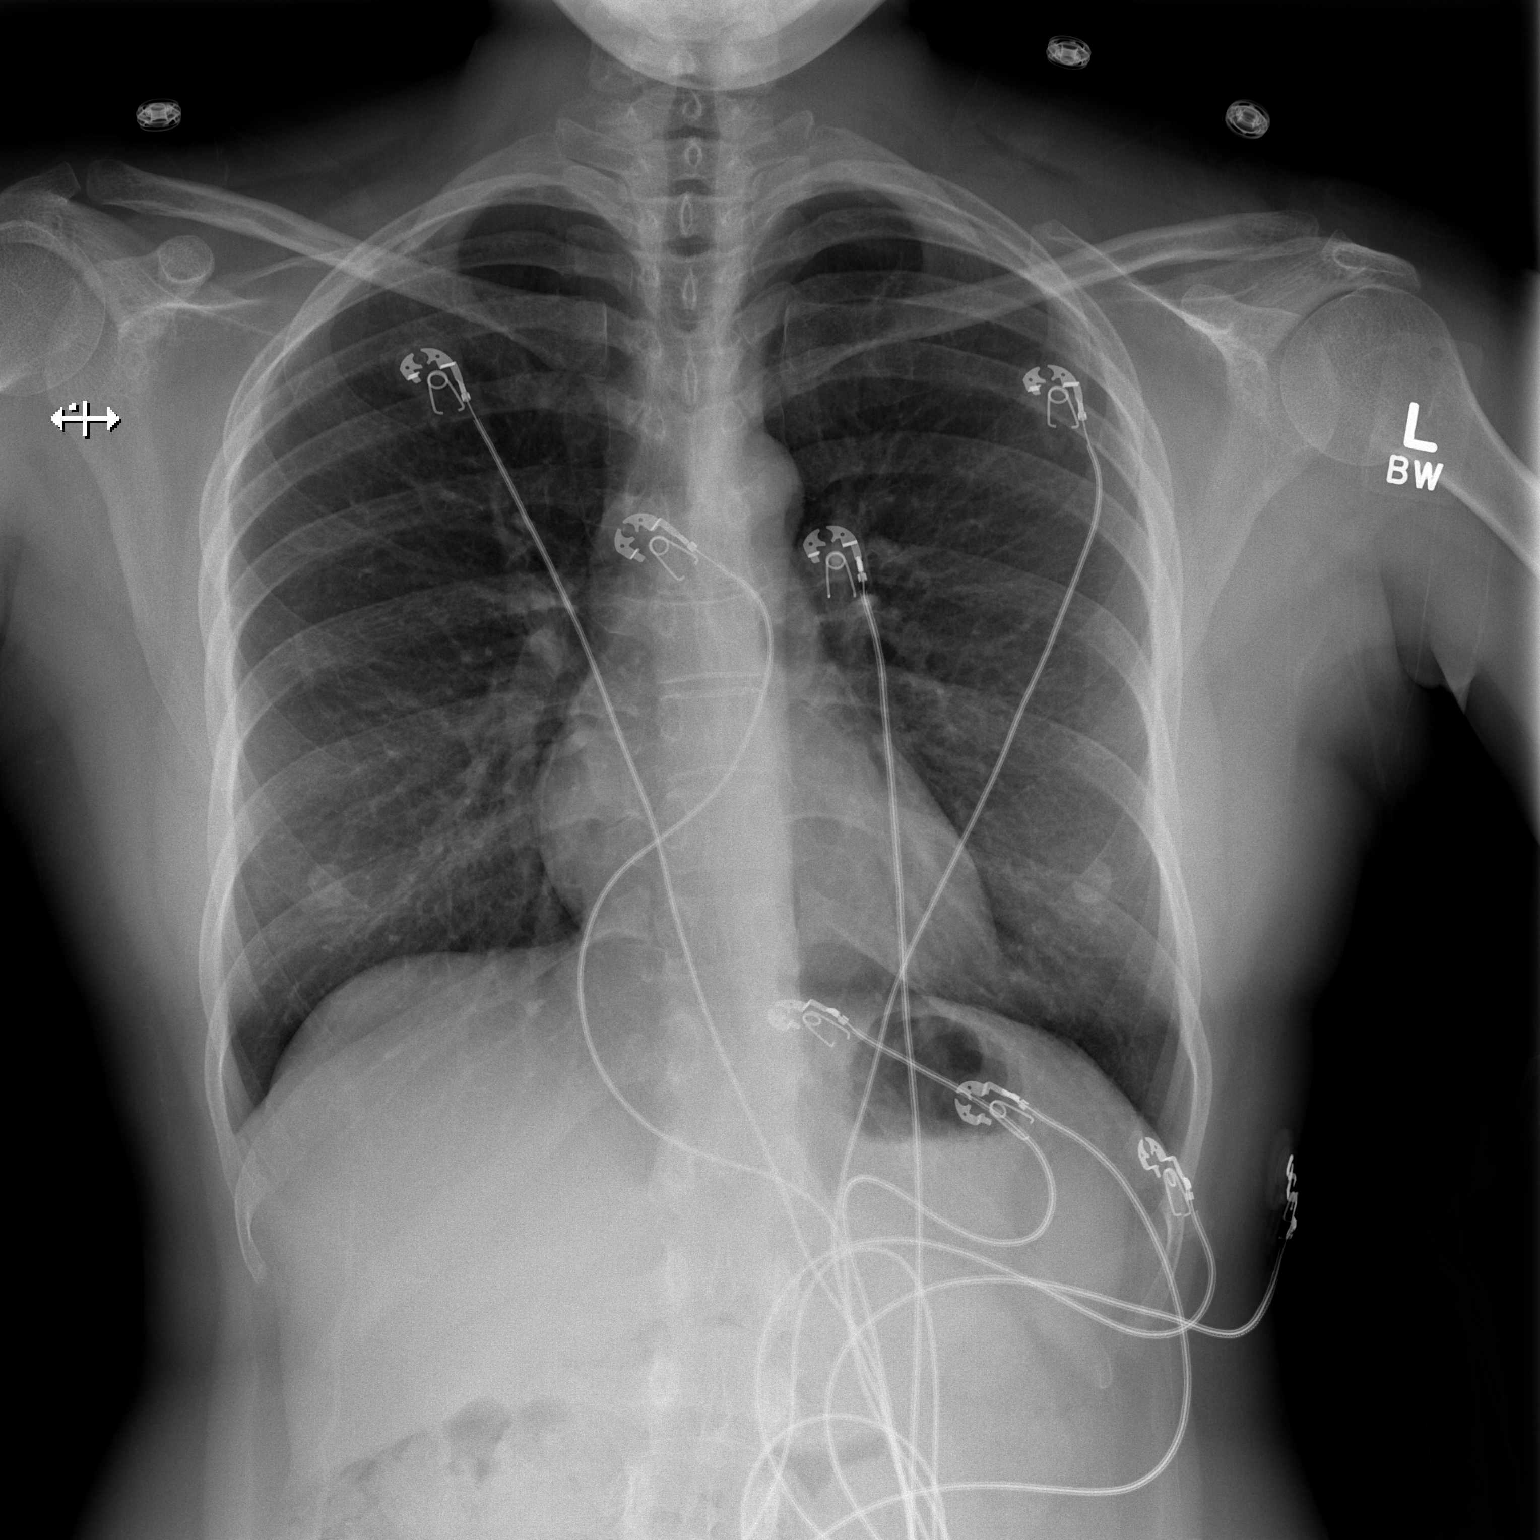

[w chest lat]
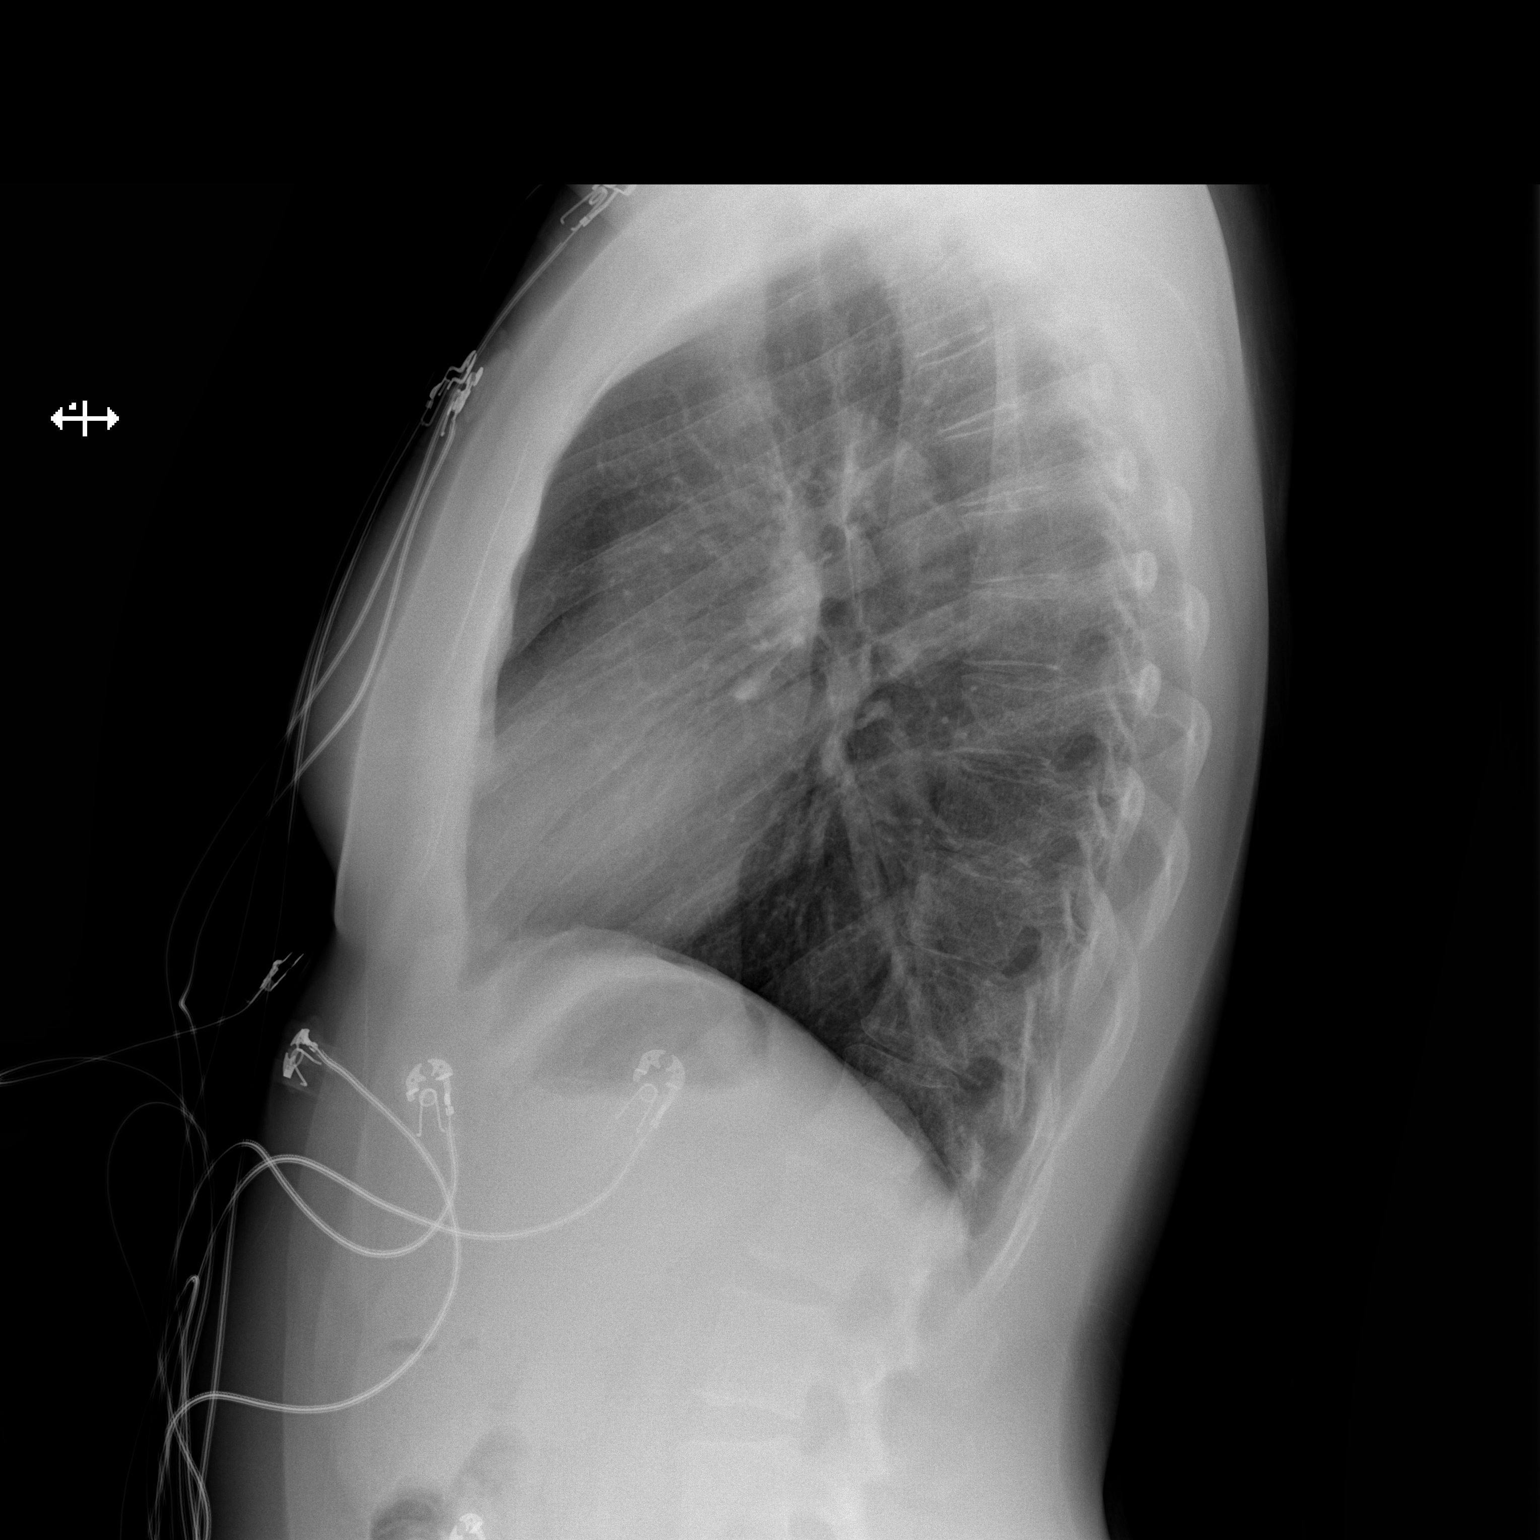

[2 of 2 positions shown; findings below may reference images not displayed]

FINDINGS: The heart size and mediastinal contours are within normal limits.
Both lungs are clear. The visualized skeletal structures are
unremarkable.
IMPRESSION: No active cardiopulmonary disease.

## 2018-11-04 HISTORY — PX: DIAGNOSTIC LAPAROSCOPY: SUR761

## 2019-10-18 ENCOUNTER — Other Ambulatory Visit: Payer: Self-pay | Admitting: Obstetrics and Gynecology

## 2020-01-31 ENCOUNTER — Ambulatory Visit: Payer: Medicaid Other | Attending: Internal Medicine

## 2020-01-31 DIAGNOSIS — Z23 Encounter for immunization: Secondary | ICD-10-CM

## 2020-01-31 NOTE — Progress Notes (Signed)
   Covid-19 Vaccination Clinic  Name:  Monique Hughes    MRN: [id number] DOB: 06-Mar-1981  01/31/2020  Monique Hughes was observed post Covid-19 immunization for 15 minutes without incident. She was provided with Vaccine Information Sheet and instruction to access the V-Safe system.   Monique Hughes was instructed to call 911 with any severe reactions post vaccine: Monique Hughes Kitchen Difficulty breathing  . Swelling of face and throat  . A fast heartbeat  . A bad rash all over body  . Dizziness and weakness   Immunizations Administered    Name Date Dose VIS Date Route   Pfizer COVID-19 Vaccine 01/31/2020  3:10 PM 0.3 mL 10/15/2019 Intramuscular   Manufacturer: [place]   Lot: [id number]   [place]: [id number]

## 2020-02-23 ENCOUNTER — Ambulatory Visit: Payer: Medicaid Other | Attending: Internal Medicine

## 2020-02-23 DIAGNOSIS — Z23 Encounter for immunization: Secondary | ICD-10-CM

## 2020-02-23 NOTE — Progress Notes (Signed)
   Covid-19 Vaccination Clinic  Name:  Monique Hughes    MRN: [id number] DOB: 26-Jan-1981  02/23/2020  [name] was observed post Covid-19 immunization for 15 minutes without incident. She was provided with Vaccine Information Sheet and instruction to access the V-Safe system.   [name] was instructed to call 911 with any severe reactions post vaccine: [place] Kitchen Difficulty breathing  . Swelling of face and throat  . A fast heartbeat  . A bad rash all over body  . Dizziness and weakness   Immunizations Administered    Name Date Dose VIS Date Route   Pfizer COVID-19 Vaccine 02/23/2020  1:53 PM 0.3 mL 12/29/2018 Intramuscular   Manufacturer: [place]   Lot: [id number]   [place]: [id number]

## 2020-08-10 ENCOUNTER — Other Ambulatory Visit: Payer: Self-pay | Admitting: Urology

## 2020-08-11 NOTE — Patient Instructions (Addendum)
DUE TO COVID-19 ONLY ONE VISITOR IS ALLOWED TO COME WITH YOU AND STAY IN THE WAITING ROOM ONLY DURING PRE OP AND PROCEDURE DAY OF SURGERY. THE 1 VISITOR  MAY VISIT WITH YOU AFTER SURGERY IN YOUR PRIVATE ROOM DURING VISITING HOURS ONLY!  YOU NEED TO HAVE A COVID 19 TEST ON_10/15______ @_2 :00 p______, THIS TEST MUST BE DONE BEFORE SURGERY,  COVID TESTING SITE [address] [place] [address], IT IS ON THE RIGHT GOING OUT [address] APPROXIMATELY  2 MINUTES PAST [employer] ON THE RIGHT. ONCE YOUR COVID TEST IS COMPLETED,  PLEASE BEGIN THE QUARANTINE INSTRUCTIONS AS OUTLINED IN YOUR HANDOUT.                Monique Hughes   Your procedure is scheduled on: 08/22/20   Report to [facility] Main  Entrance   Report to admitting at  8:30 AM     Call this number if you have problems the morning of surgery [phone]    Remember: Do not eat food or drink liquids :After Midnight.   BRUSH YOUR TEETH MORNING OF SURGERY AND RINSE YOUR MOUTH OUT, NO CHEWING GUM CANDY OR MINTS.     Take these medicines the morning of surgery with A SIP OF WATER: Pantoprazole, Myrbetriq, Use your eye drops as usual         . Use your inhaler and bring to the hospital                                 You may not have any metal on your body including hair pins and              piercings  Do not wear jewelry, make-up, lotions, powders or perfumes, deodorant             Do not wear nail polish on your fingernails.  Do not shave  48 hours prior to surgery.             Do not bring valuables to the hospital. [place].  Contacts, dentures or bridgework may not be worn into surgery.       Patients discharged the day of surgery will not be allowed to drive home.   IF YOU ARE HAVING SURGERY AND GOING HOME THE SAME DAY, YOU MUST HAVE AN ADULT TO DRIVE YOU HOME AND BE WITH YOU FOR 24 HOURS.   YOU MAY GO HOME BY TAXI OR UBER OR  ORTHERWISE, BUT AN ADULT MUST ACCOMPANY YOU HOME AND STAY WITH YOU FOR 24 HOURS.  Name and phone number of your driver:  Special Instructions: N/A              Please read over the following fact sheets you were given: _____________________________________________________________________             [facility] - Preparing for Surgery Before surgery, you can play an important role.   Because skin is not sterile, your skin needs to be as free of germs as possible .  You can reduce the number of germs on your skin by washing with CHG (chlorahexidine gluconate) soap before surgery.   CHG is an antiseptic cleaner which kills germs and bonds with the skin to continue killing germs even after washing. Please DO NOT use if  you have an allergy to CHG or antibacterial soaps.   If your skin becomes reddened/irritated stop using the CHG and inform your nurse when you arrive at Short Stay. Do not shave (including legs and underarms) for at least 48 hours prior to the first CHG shower  Please follow these instructions carefully:  1.  Shower with CHG Soap the night before surgery and the  morning of Surgery.  2.  If you choose to wash your hair, wash your hair first as usual with your  normal  shampoo.  3.  After you shampoo, rinse your hair and body thoroughly to remove the  shampoo.                                        4.  Use CHG as you would any other liquid soap.  You can apply chg directly  to the skin and wash                       Gently with a scrungie or clean washcloth.  5.  Apply the CHG Soap to your body ONLY FROM THE NECK DOWN.   Do not use on face/ open                           Wound or open sores. Avoid contact with eyes, ears mouth and genitals (private parts).                       Wash face,  Genitals (private parts) with your normal soap.             6.  Wash thoroughly, paying special attention to the area where your surgery  will be performed.  7.  Thoroughly rinse your body  with warm water from the neck down.  8.  DO NOT shower/wash with your normal soap after using and rinsing off  the CHG Soap.             9.  Pat yourself dry with a clean towel.            10.  Wear clean pajamas.            11.  Place clean sheets on your bed the night of your first shower and do not  sleep with pets. Day of Surgery : Do not apply any lotions/deodorants the morning of surgery.  Please wear clean clothes to the hospital/surgery center.  FAILURE TO FOLLOW THESE INSTRUCTIONS MAY RESULT IN THE CANCELLATION OF YOUR SURGERY PATIENT SIGNATURE_________________________________  NURSE SIGNATURE__________________________________  ________________________________________________________________________

## 2020-08-15 ENCOUNTER — Encounter (HOSPITAL_COMMUNITY): Payer: Self-pay

## 2020-08-15 ENCOUNTER — Encounter (HOSPITAL_COMMUNITY)
Admission: RE | Admit: 2020-08-15 | Discharge: 2020-08-15 | Disposition: A | Discharge status: Home / Self Care | Payer: 59 | Source: Ambulatory Visit | Attending: Urology | Admitting: Urology

## 2020-08-15 ENCOUNTER — Other Ambulatory Visit: Payer: Self-pay

## 2020-08-15 HISTORY — DX: Cardiac murmur, unspecified: R01.1

## 2020-08-15 HISTORY — DX: Pneumonia, unspecified organism: J18.9

## 2020-08-15 HISTORY — DX: Headache, unspecified: R51.9

## 2020-08-15 HISTORY — DX: Anemia, unspecified: D64.9

## 2020-08-15 HISTORY — DX: Gastro-esophageal reflux disease without esophagitis: K21.9

## 2020-08-15 HISTORY — DX: Anxiety disorder, unspecified: F41.9

## 2020-08-15 NOTE — Progress Notes (Signed)
COVID Vaccine Completed:Yes Date COVID Vaccine completed:02/23/20 COVID vaccine manufacturer: Pfizer   PCP - Dr. Garlon Hatchet Cardiologist -none   Chest x-ray - no EKG - no Stress Test - no ECHO - no Cardiac Cath - no Pacemaker/ICD device last checked:NA  Sleep Study - no CPAP -   Fasting Blood Sugar - NA Checks Blood Sugar _____ times a day  Blood Thinner Instructions:NA Aspirin Instructions: Last Dose:  Anesthesia review:   Patient denies shortness of breath, fever, cough and chest pain at PAT appointment yes   Patient verbalized understanding of instructions that were given to them at the PAT appointment. Patient was also instructed that they will need to review over the PAT instructions again at home before surgery. Yes   Pt is active and tries to walk every day. She has no SOB climbing stairs, doing housework or with ADLs. She only needs her inhaler when seasonal allergies are bad.  She was unaware of her PAT visit so it was via phone. CBC will be drawn DOS

## 2020-08-18 ENCOUNTER — Other Ambulatory Visit (HOSPITAL_COMMUNITY)
Admission: RE | Admit: 2020-08-18 | Discharge: 2020-08-18 | Disposition: A | Discharge status: Home / Self Care | Payer: 59 | Source: Ambulatory Visit | Attending: Urology | Admitting: Urology

## 2020-08-18 DIAGNOSIS — Z20822 Contact with and (suspected) exposure to covid-19: Secondary | ICD-10-CM | POA: Insufficient documentation

## 2020-08-18 DIAGNOSIS — Z01812 Encounter for preprocedural laboratory examination: Secondary | ICD-10-CM | POA: Diagnosis present

## 2020-08-19 LAB — SARS CORONAVIRUS 2 (TAT 6-24 HRS): SARS Coronavirus 2: NEGATIVE

## 2020-08-21 NOTE — H&P (Signed)
CC/HPI: cc: bladder pain/IC   08/08/20: 39 year old woman with a history of bladder pain/IC referred by Dr. Helane Rima. Patient has seen Dr. Amalia Hailey in the past. She thinks she may have had a urodynamic study done. Patient has episodes of worsen symptoms with pain, pressure, nocturia and frequency. She has failed Myrbetriq am Jan tested. She does have vaginal Valium p.r.n. which sometimes helps. She also uses Detrol LA 4 mg which sometimes help. She does have a history of endometriosis and has had 3 laparoscopic procedures. Triggers are stress and menstrual cycle. She has not tried pelvic floor physical therapy, hydrodistention or bladder installations.     ALLERGIES: acid products sulfa tramadol - Hives Vicodin - bad headaches    MEDICATIONS: Detrol La  Ketorolac Tromethamine  Percocet PRN  Flexeril PRN  Phenergan PRN  Protonix 40 mg granules delayed release for susp packet  Valium Supposiitories  Xanax PRN     GU PSH: Laparoscopic Myomectomy       PSH Notes: 3 female laparoscopic procedures   NON-GU PSH: None   GU PMH: None   NON-GU PMH: GERD    FAMILY HISTORY: 1 son - Son   SOCIAL HISTORY: Marital Status: Single Preferred Language: English; Ethnicity: Not Hispanic Or Latino; Race: Black or African American Current Smoking Status: Patient has never smoked.   Tobacco Use Assessment Completed: Used Tobacco in last 30 days? Has never drank.  Drinks 1 caffeinated drink per day.    REVIEW OF SYSTEMS:    GU Review Female:   Patient reports frequent urination, burning /pain with urination, get up at night to urinate, and leakage of urine. Patient denies hard to postpone urination, stream starts and stops, trouble starting your stream, have to strain to urinate, and being pregnant.  Gastrointestinal (Upper):   Patient denies nausea, vomiting, and indigestion/ heartburn.  Gastrointestinal (Lower):   Patient denies diarrhea and constipation.  Constitutional:   Patient denies fever,  night sweats, weight loss, and fatigue.  Skin:   Patient denies skin rash/ lesion and itching.  Eyes:   Patient denies blurred vision and double vision.  Ears/ Nose/ Throat:   Patient denies sore throat and sinus problems.  Hematologic/Lymphatic:   Patient denies swollen glands and easy bruising.  Cardiovascular:   Patient denies leg swelling and chest pains.  Respiratory:   Patient denies cough and shortness of breath.  Endocrine:   Patient denies excessive thirst.  Musculoskeletal:   Patient denies back pain and joint pain.  Neurological:   Patient denies dizziness and headaches.  Psychologic:   Patient denies depression and anxiety.   VITAL SIGNS:      08/07/2020 02:57 PM  Weight 165 lb / 74.84 kg  Height 64 in / 162.56 cm  BP 100/67 mmHg  Pulse 92 /min  BMI 28.3 kg/m   MULTI-SYSTEM PHYSICAL EXAMINATION:    Constitutional: Well-nourished. No physical deformities. Normally developed. Good grooming.  Neck: Neck symmetrical, not swollen. Normal tracheal position.  Respiratory: No labored breathing, no use of accessory muscles.   Cardiovascular: Normal temperature  Skin: No paleness, no jaundice, no cyanosis. No lesion, no ulcer, no rash.  Neurologic / Psychiatric: Oriented to time, oriented to place, oriented to person. No depression, no anxiety, no agitation.  Gastrointestinal: no rigidity, non obese abdomen.   Eyes: Normal conjunctivae. Normal eyelids.  Ears, Nose, Mouth, and Throat: Left ear no scars, no lesions, no masses. Right ear no scars, no lesions, no masses. Nose no scars, no lesions, no masses. Normal hearing.  Normal lips.  Musculoskeletal: Normal gait and station of head and neck.     Complexity of Data:  Records Review:   POC Tool  Urine Test Review:   Urinalysis  Notes:                     07/03/2020: BUN 9, creatinine 0.8   PROCEDURES:         PVR Ultrasound - 52080  Scanned Volume: 57 cc         Urinalysis - 81003 Dipstick Dipstick Cont'd  Color: Yellow  Bilirubin: Neg  Appearance: Clear Ketones: Neg  Specific Gravity: 1.025 Blood: Neg  pH: 6.0 Protein: Neg  Glucose: Neg Urobilinogen: 0.2    Nitrites: Neg    Leukocyte Esterase: Neg    Notes:      ASSESSMENT:      ICD-10 Details  1 GU:   Pelvic/perineal pain - R10.2 Chronic, Stable - We discussed patient's symptoms and flare ups. I think she would benefit from pelvic floor physical therapy at a minimum. In addition we discussed bladder installations versus hydrodistention. Patient is interested in hydro stent shin and we discussed how this is performed. She will be set up for cystoscopy, hydrodistention and possible biopsy as an outpatient procedure. She understands the trial and error nature of treating chronic pelvic pain syndrome/painful bladder syndrome.  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

## 2020-08-22 ENCOUNTER — Ambulatory Visit (HOSPITAL_COMMUNITY): Payer: 59 | Admitting: Certified Registered Nurse Anesthetist

## 2020-08-22 ENCOUNTER — Ambulatory Visit (HOSPITAL_COMMUNITY)
Admission: RE | Admit: 2020-08-22 | Discharge: 2020-08-22 | Disposition: A | Discharge status: Home / Self Care | Payer: 59 | Source: Ambulatory Visit | Attending: Urology | Admitting: Urology

## 2020-08-22 ENCOUNTER — Encounter (HOSPITAL_COMMUNITY)
Admission: RE | Disposition: A | Discharge status: Home / Self Care | Payer: Self-pay | Source: Ambulatory Visit | Attending: Urology

## 2020-08-22 ENCOUNTER — Encounter (HOSPITAL_COMMUNITY): Payer: Self-pay | Admitting: Urology

## 2020-08-22 DIAGNOSIS — Z888 Allergy status to other drugs, medicaments and biological substances status: Secondary | ICD-10-CM | POA: Diagnosis not present

## 2020-08-22 DIAGNOSIS — F419 Anxiety disorder, unspecified: Secondary | ICD-10-CM | POA: Insufficient documentation

## 2020-08-22 DIAGNOSIS — K219 Gastro-esophageal reflux disease without esophagitis: Secondary | ICD-10-CM | POA: Insufficient documentation

## 2020-08-22 DIAGNOSIS — Z79899 Other long term (current) drug therapy: Secondary | ICD-10-CM | POA: Insufficient documentation

## 2020-08-22 DIAGNOSIS — Z882 Allergy status to sulfonamides status: Secondary | ICD-10-CM | POA: Insufficient documentation

## 2020-08-22 DIAGNOSIS — N3289 Other specified disorders of bladder: Secondary | ICD-10-CM | POA: Insufficient documentation

## 2020-08-22 DIAGNOSIS — N3010 Interstitial cystitis (chronic) without hematuria: Secondary | ICD-10-CM | POA: Insufficient documentation

## 2020-08-22 DIAGNOSIS — Z885 Allergy status to narcotic agent status: Secondary | ICD-10-CM | POA: Diagnosis not present

## 2020-08-22 DIAGNOSIS — G894 Chronic pain syndrome: Secondary | ICD-10-CM | POA: Diagnosis not present

## 2020-08-22 DIAGNOSIS — J45909 Unspecified asthma, uncomplicated: Secondary | ICD-10-CM | POA: Diagnosis not present

## 2020-08-22 HISTORY — PX: CYSTOSCOPY WITH HYDRODISTENSION AND BIOPSY: SHX5127

## 2020-08-22 LAB — CBC
HCT: 37.3 % (ref 36.0–46.0)
Hemoglobin: 12.4 g/dL (ref 12.0–15.0)
MCH: 31.5 pg (ref 26.0–34.0)
MCHC: 33.2 g/dL (ref 30.0–36.0)
MCV: 94.7 fL (ref 80.0–100.0)
Platelets: 388 10*3/uL (ref 150–400)
RBC: 3.94 MIL/uL (ref 3.87–5.11)
RDW: 11.9 % (ref 11.5–15.5)
WBC: 6.3 10*3/uL (ref 4.0–10.5)
nRBC: 0 % (ref 0.0–0.2)

## 2020-08-22 LAB — PREGNANCY, URINE: Preg Test, Ur: NEGATIVE

## 2020-08-22 SURGERY — CYSTOSCOPY, WITH BLADDER HYDRODISTENSION AND BIOPSY
Anesthesia: General

## 2020-08-22 MED ORDER — ONDANSETRON HCL 4 MG/2ML IJ SOLN
INTRAMUSCULAR | Status: DC | PRN
  Administered 2020-08-22: 4 mg via INTRAVENOUS

## 2020-08-22 MED ORDER — SODIUM BICARBONATE 8.4 % IV SOLN
INTRAVENOUS | Status: DC | PRN
  Administered 2020-08-22: 50 meq

## 2020-08-22 MED ORDER — AMISULPRIDE (ANTIEMETIC) 5 MG/2ML IV SOLN: 10.0000 mg | Freq: Once | INTRAVENOUS | Status: DC | PRN

## 2020-08-22 MED ORDER — FENTANYL CITRATE (PF) 100 MCG/2ML IJ SOLN: 25.0000 ug | INTRAMUSCULAR | Status: DC | PRN

## 2020-08-22 MED ORDER — PROPOFOL 10 MG/ML IV BOLUS
INTRAVENOUS | Status: DC | PRN
  Administered 2020-08-22: 200 mg via INTRAVENOUS

## 2020-08-22 MED ORDER — BUPIVACAINE HCL 0.5 % IJ SOLN
INTRAMUSCULAR | Status: DC | PRN
  Administered 2020-08-22: 30 mL

## 2020-08-22 MED ORDER — ORAL CARE MOUTH RINSE: 15.0000 mL | Freq: Once | OROMUCOSAL | Status: AC

## 2020-08-22 MED ORDER — ONDANSETRON HCL 4 MG/2ML IJ SOLN: 4.0000 mg | Freq: Once | INTRAMUSCULAR | Status: DC | PRN

## 2020-08-22 MED ORDER — HEPARIN SODIUM (PORCINE) 10000 UNIT/ML IJ SOLN
INTRAMUSCULAR | Status: DC | PRN
  Administered 2020-08-22: 10000 [IU]

## 2020-08-22 MED ORDER — OXYCODONE-ACETAMINOPHEN 5-325 MG PO TABS
1.0000 | ORAL_TABLET | ORAL | 0 refills | Status: AC | PRN
Start: 2020-08-22 — End: 2021-08-22

## 2020-08-22 MED ORDER — MIDAZOLAM HCL 2 MG/2ML IJ SOLN
INTRAMUSCULAR | Status: AC
  Filled 2020-08-22: qty 2

## 2020-08-22 MED ORDER — OXYCODONE HCL 5 MG PO TABS: 5.0000 mg | ORAL_TABLET | Freq: Once | ORAL | Status: DC | PRN

## 2020-08-22 MED ORDER — MIDAZOLAM HCL 5 MG/5ML IJ SOLN
INTRAMUSCULAR | Status: DC | PRN
  Administered 2020-08-22: 2 mg via INTRAVENOUS

## 2020-08-22 MED ORDER — CEFAZOLIN SODIUM-DEXTROSE 2-4 GM/100ML-% IV SOLN
2.0000 g | INTRAVENOUS | Status: AC
  Administered 2020-08-22: 2 g via INTRAVENOUS
  Filled 2020-08-22: qty 100

## 2020-08-22 MED ORDER — PHENAZOPYRIDINE HCL 200 MG PO TABS: 200.0000 mg | ORAL_TABLET | Freq: Three times a day (TID) | ORAL | 0 refills | Status: AC | PRN

## 2020-08-22 MED ORDER — SODIUM CHLORIDE 0.9 % IR SOLN
Freq: Once | Status: DC
  Filled 2020-08-22: qty 500

## 2020-08-22 MED ORDER — SODIUM CHLORIDE 0.9 % IR SOLN: PREFILLED_SYRINGE | Freq: Once | Status: DC

## 2020-08-22 MED ORDER — DEXAMETHASONE SODIUM PHOSPHATE 10 MG/ML IJ SOLN
INTRAMUSCULAR | Status: DC | PRN
  Administered 2020-08-22: 5 mg via INTRAVENOUS

## 2020-08-22 MED ORDER — LIDOCAINE 2% (20 MG/ML) 5 ML SYRINGE
INTRAMUSCULAR | Status: DC | PRN
  Administered 2020-08-22: 60 mg via INTRAVENOUS

## 2020-08-22 MED ORDER — LACTATED RINGERS IV SOLN: INTRAVENOUS | Status: DC

## 2020-08-22 MED ORDER — FENTANYL CITRATE (PF) 100 MCG/2ML IJ SOLN
INTRAMUSCULAR | Status: DC | PRN
Start: 2020-08-22 — End: 2020-08-22
  Administered 2020-08-22 (×2): 50 ug via INTRAVENOUS

## 2020-08-22 MED ORDER — OXYCODONE HCL 5 MG/5ML PO SOLN: 5.0000 mg | Freq: Once | ORAL | Status: DC | PRN

## 2020-08-22 MED ORDER — STERILE WATER FOR IRRIGATION IR SOLN
Status: DC | PRN
  Administered 2020-08-22: 6000 mL

## 2020-08-22 MED ORDER — CHLORHEXIDINE GLUCONATE 0.12 % MT SOLN
15.0000 mL | Freq: Once | OROMUCOSAL | Status: AC
  Administered 2020-08-22: 15 mL via OROMUCOSAL

## 2020-08-22 MED ORDER — FENTANYL CITRATE (PF) 100 MCG/2ML IJ SOLN
INTRAMUSCULAR | Status: AC
  Filled 2020-08-22: qty 2

## 2020-08-22 MED ORDER — METHYLPREDNISOLONE SODIUM SUCC 125 MG IJ SOLR
INTRAMUSCULAR | Status: DC | PRN
  Administered 2020-08-22: 40 mg

## 2020-08-22 SURGICAL SUPPLY — 25 items
ADAPTER CATH WHT DISP STRL (CATHETERS) ×3 IMPLANT
ADPR CATH MP STRL LF DISP BD (CATHETERS) ×1
BAG URO CATCHER STRL LF (MISCELLANEOUS) ×3 IMPLANT
BOOTIES KNEE HIGH SLOAN (MISCELLANEOUS) IMPLANT
CATH ROBINSON RED A/P 16FR (CATHETERS) ×3 IMPLANT
CLOTH BEACON ORANGE TIMEOUT ST (SAFETY) ×3 IMPLANT
ELECT REM PT RETURN 15FT ADLT (MISCELLANEOUS) ×3 IMPLANT
GLOVE BIO SURGEON STRL SZ7.5 (GLOVE) ×3 IMPLANT
GLOVE ECLIPSE 8.0 STRL XLNG CF (GLOVE) IMPLANT
GOWN STRL REUS W/ TWL LRG LVL3 (GOWN DISPOSABLE) ×1 IMPLANT
GOWN STRL REUS W/ TWL XL LVL3 (GOWN DISPOSABLE) ×1 IMPLANT
GOWN STRL REUS W/TWL LRG LVL3 (GOWN DISPOSABLE) ×3
GOWN STRL REUS W/TWL XL LVL3 (GOWN DISPOSABLE) ×3
KIT TURNOVER KIT A (KITS) ×3 IMPLANT
MANIFOLD NEPTUNE II (INSTRUMENTS) IMPLANT
NDL SAFETY ECLIPSE 18X1.5 (NEEDLE) ×1 IMPLANT
NEEDLE HYPO 18GX1.5 SHARP (NEEDLE) ×3
NEEDLE HYPO 22GX1.5 SAFETY (NEEDLE) IMPLANT
NEEDLE SPNL 22GX7 QUINCKE BK (NEEDLE) IMPLANT
SYR 20ML LL LF (SYRINGE) ×6 IMPLANT
SYR BULB IRRIG 60ML STRL (SYRINGE) ×3 IMPLANT
TUBING CONNECTING 10 (TUBING) ×2 IMPLANT
TUBING CONNECTING 10' (TUBING) ×1
WATER STERILE IRR 3000ML UROMA (IV SOLUTION) ×3 IMPLANT
WATER STERILE IRR 500ML POUR (IV SOLUTION) IMPLANT

## 2020-08-22 NOTE — Interval H&P Note (Signed)
History and Physical Interval Note:  08/22/2020 8:41 AM  Monique Hughes  has presented today for surgery, with the diagnosis of INTERSTITIAL CYSTITIS.  The various methods of treatment have been discussed with the patient and family. After consideration of risks, benefits and other options for treatment, the patient has consented to  Procedure(s) with comments: CYSTOSCOPY/POSSIBLE BIOPSY/HYDRODISTENSION (N/A) - 72 MINS as a surgical intervention.  The patient's history has been reviewed, patient examined, no change in status, stable for surgery.  I have reviewed the patient's chart and labs.  Questions were answered to the patient's satisfaction.     Estaban Mainville D Cerena Baine

## 2020-08-22 NOTE — Transfer of Care (Signed)
Immediate Anesthesia Transfer of Care Note  Patient: Monique Hughes  Procedure(s) Performed: CYSTOSCOPY/BIOPSY/HYDRODISTENSION (N/A )  Patient Location: PACU  Anesthesia Type:General  Level of Consciousness: awake, alert , oriented and patient cooperative  Airway & Oxygen Therapy: Patient Spontanous Breathing and Patient connected to face mask oxygen  Post-op Assessment: Report given to RN and Post -op Vital signs reviewed and stable  Post vital signs: Reviewed and stable  Last Vitals:  Vitals Value Taken Time  BP    Temp    Pulse 96 08/22/20 1136  Resp 11 08/22/20 1136  SpO2 100 % 08/22/20 1136  Vitals shown include unvalidated device data.  Last Pain:  Vitals:   08/22/20 0905  TempSrc:   PainSc: 6          Complications: No complications documented.

## 2020-08-22 NOTE — Op Note (Signed)
Operative Note  Preoperative diagnosis:  1.  Interstitial cystitis   Postoperative diagnosis: 1.  Interstitial cystitis  Procedure(s): 1.  Cystoscopy, hydrodistension, bladder biopsy with fulguration  Surgeon: Jacalyn Lefevre, MD  Assistants:  None  Anesthesia:  General  Complications:  None  EBL:   5 mL  Specimens: 1. Bladder biopsy  Drains/Catheters: 1.  none  Intraoperative findings:   1. Normal urethra 2. Normal cystoscopy with orthotopic UOs at start of case 3. Bladder capacity filled to 800 mL under gravity drainage with irrigant 80 cm above patient 4. Friable/oozing posterior wall and lateral to bilateral trigones after distension 5. Bladder instillation with heprain, solumedrol, marcaine and sodium bicarbonate  Indication:  Monique Hughes is a 39 y.o. female with interstitial cystitis refractory to medical management.  After risks and benefits of cystoscopy with possible bladder biopsy and hydrodistention were discussed with the patient she elected to proceed.  Description of procedure: After risks and benefits of the procedure were discussed informed consent was obtained.  The patient was then taken to the operating room and general anesthesia was induced.  She is then repositioned in the dorsolithotomy position.  She was prepped and draped in the usual sterile fashion entire was performed.  76 French rigid cystoscope was placed in the meatus advanced in the bladder and direct visualization.  Cystoscopy took place and revealed normal mucosa with orthotopic bilateral ureteral orifices.  Clear reflux was seen from both UOs.  The patient's bladder was then filled under gravity with irrigant approximately 80 cm above the patient.  This was then held for approximately 2 minutes.  Patient's bladder was decompressed and noted to have approximately 800 mL of bladder capacity.  Cystoscopy then took place again and was noted to have erythema prominently on the posterior wall and  lateral to the trigones.  It appeared that this tissue was oozing as well.  Hydrodistension then took place 2 more times in a similar manner with max capacity reaching approximately 875 mL.  At the end of the hydrodistention a bladder biopsy was taken with cold cup graspers from the posterior wall.  Bugbee electrocautery was then used to fulgurate this area.  Hemostasis was adequate with the irrigant turned off.  A 16 French straight catheter was then placed and a bladder instillation was performed.  The catheter was removed and the patient was awakened from anesthesia.  She was transferred the PACU in stable condition.  Follow-up: Patient will have follow-up in approximately 2 weeks to see how bladder pain is and go over bladder biopsy results    Plan:

## 2020-08-22 NOTE — Anesthesia Postprocedure Evaluation (Signed)
Anesthesia Post Note  Patient: Hellena L Asano  Procedure(s) Performed: CYSTOSCOPY/BIOPSY/HYDRODISTENSION (N/A )     Patient location during evaluation: PACU Anesthesia Type: General Level of consciousness: awake and alert Pain management: pain level controlled Vital Signs Assessment: post-procedure vital signs reviewed and stable Respiratory status: spontaneous breathing, nonlabored ventilation and respiratory function stable Cardiovascular status: blood pressure returned to baseline and stable Postop Assessment: no apparent nausea or vomiting Anesthetic complications: no   No complications documented.  Last Vitals:  Vitals:   08/22/20 1200 08/22/20 1205  BP: 115/86 (!) 149/86  Pulse: 84 93  Resp: 18 18  Temp: 36.5 C 36.4 C  SpO2: 100% 100%    Last Pain:  Vitals:   08/22/20 1205  TempSrc:   PainSc: 7                  Lidia Collum

## 2020-08-22 NOTE — Anesthesia Preprocedure Evaluation (Addendum)
Anesthesia Evaluation  Patient identified by MRN, date of birth, ID band Patient awake    Reviewed: Allergy & Precautions, NPO status , Patient's Chart, lab work & pertinent test results  History of Anesthesia Complications Negative for: history of anesthetic complications  Airway Mallampati: II  TM Distance: >3 FB Neck ROM: Full    Dental  (+) Teeth Intact   Pulmonary asthma ,    Pulmonary exam normal        Cardiovascular negative cardio ROS Normal cardiovascular exam     Neuro/Psych Anxiety negative neurological ROS     GI/Hepatic Neg liver ROS, GERD  ,  Endo/Other  negative endocrine ROS  Renal/GU negative Renal ROS Bladder dysfunction (interstitial cystitis)      Musculoskeletal negative musculoskeletal ROS (+)   Abdominal   Peds  Hematology negative hematology ROS (+)   Anesthesia Other Findings   Reproductive/Obstetrics negative OB ROS                            Anesthesia Physical Anesthesia Plan  ASA: II  Anesthesia Plan: General   Post-op Pain Management:    Induction: Intravenous  PONV Risk Score and Plan: 3 and Ondansetron, Dexamethasone, Midazolam and Treatment may vary due to age or medical condition  Airway Management Planned: LMA  Additional Equipment: None  Intra-op Plan:   Post-operative Plan: Extubation in OR  Informed Consent: I have reviewed the patients History and Physical, chart, labs and discussed the procedure including the risks, benefits and alternatives for the proposed anesthesia with the patient or authorized representative who has indicated his/her understanding and acceptance.     Dental advisory given  Plan Discussed with:   Anesthesia Plan Comments:        Anesthesia Quick Evaluation

## 2020-08-22 NOTE — Discharge Instructions (Signed)

## 2020-08-22 NOTE — Anesthesia Procedure Notes (Signed)
Procedure Name: LMA Insertion Performed by: Gean Maidens, CRNA Pre-anesthesia Checklist: Patient identified, Emergency Drugs available, Suction available, Patient being monitored and Timeout performed Patient Re-evaluated:Patient Re-evaluated prior to induction Oxygen Delivery Method: Circle system utilized Preoxygenation: Pre-oxygenation with 100% oxygen Induction Type: IV induction Ventilation: Mask ventilation without difficulty LMA: LMA inserted LMA Size: 4.0 Number of attempts: 1 Placement Confirmation: positive ETCO2 and breath sounds checked- equal and bilateral Tube secured with: Tape Dental Injury: Teeth and Oropharynx as per pre-operative assessment  Comments: Performed by Jacqualine Code

## 2020-08-23 ENCOUNTER — Encounter (HOSPITAL_COMMUNITY): Payer: Self-pay | Admitting: Urology

## 2020-08-23 LAB — SURGICAL PATHOLOGY

## 2021-04-03 ENCOUNTER — Other Ambulatory Visit (HOSPITAL_COMMUNITY): Payer: Self-pay | Admitting: Nurse Practitioner

## 2021-04-03 DIAGNOSIS — Z8249 Family history of ischemic heart disease and other diseases of the circulatory system: Secondary | ICD-10-CM

## 2021-04-03 DIAGNOSIS — M7989 Other specified soft tissue disorders: Secondary | ICD-10-CM

## 2021-04-19 ENCOUNTER — Ambulatory Visit (HOSPITAL_COMMUNITY): Payer: 59 | Attending: Nurse Practitioner

## 2021-04-19 ENCOUNTER — Other Ambulatory Visit: Payer: Self-pay

## 2021-04-19 DIAGNOSIS — M7989 Other specified soft tissue disorders: Secondary | ICD-10-CM | POA: Insufficient documentation

## 2021-04-19 DIAGNOSIS — Z8249 Family history of ischemic heart disease and other diseases of the circulatory system: Secondary | ICD-10-CM | POA: Diagnosis not present

## 2021-04-19 DIAGNOSIS — R600 Localized edema: Secondary | ICD-10-CM | POA: Diagnosis not present

## 2021-04-19 LAB — ECHOCARDIOGRAM COMPLETE
Area-P 1/2: 4.29 cm2
S' Lateral: 2.9 cm

## 2021-04-19 MED ORDER — PERFLUTREN LIPID MICROSPHERE
1.0000 mL | INTRAVENOUS | Status: AC | PRN
  Administered 2021-04-19: 2 mL via INTRAVENOUS

## 2022-02-18 ENCOUNTER — Emergency Department (HOSPITAL_COMMUNITY)
Admission: EM | Admit: 2022-02-18 | Discharge: 2022-02-18 | Discharge status: Against Medical Advice | Payer: Commercial Managed Care - HMO | Attending: Emergency Medicine | Admitting: Emergency Medicine

## 2022-02-18 ENCOUNTER — Other Ambulatory Visit: Payer: Self-pay

## 2022-02-18 ENCOUNTER — Ambulatory Visit: Payer: Managed Care, Other (non HMO) | Admitting: Physical Therapy

## 2022-02-18 ENCOUNTER — Encounter (HOSPITAL_COMMUNITY): Payer: Self-pay

## 2022-02-18 ENCOUNTER — Emergency Department (HOSPITAL_COMMUNITY): Payer: Commercial Managed Care - HMO

## 2022-02-18 DIAGNOSIS — Z7951 Long term (current) use of inhaled steroids: Secondary | ICD-10-CM | POA: Insufficient documentation

## 2022-02-18 DIAGNOSIS — R519 Headache, unspecified: Secondary | ICD-10-CM | POA: Diagnosis not present

## 2022-02-18 DIAGNOSIS — R791 Abnormal coagulation profile: Secondary | ICD-10-CM | POA: Insufficient documentation

## 2022-02-18 DIAGNOSIS — M62830 Muscle spasm of back: Secondary | ICD-10-CM

## 2022-02-18 DIAGNOSIS — J45909 Unspecified asthma, uncomplicated: Secondary | ICD-10-CM | POA: Insufficient documentation

## 2022-02-18 DIAGNOSIS — R079 Chest pain, unspecified: Secondary | ICD-10-CM | POA: Insufficient documentation

## 2022-02-18 DIAGNOSIS — M546 Pain in thoracic spine: Secondary | ICD-10-CM | POA: Diagnosis present

## 2022-02-18 LAB — CBC WITH DIFFERENTIAL/PLATELET
Abs Immature Granulocytes: 0.02 10*3/uL (ref 0.00–0.07)
Basophils Absolute: 0 10*3/uL (ref 0.0–0.1)
Basophils Relative: 1 %
Eosinophils Absolute: 0.1 10*3/uL (ref 0.0–0.5)
Eosinophils Relative: 1 %
HCT: 38.2 % (ref 36.0–46.0)
Hemoglobin: 12.8 g/dL (ref 12.0–15.0)
Immature Granulocytes: 0 %
Lymphocytes Relative: 32 %
Lymphs Abs: 2.6 10*3/uL (ref 0.7–4.0)
MCH: 31.1 pg (ref 26.0–34.0)
MCHC: 33.5 g/dL (ref 30.0–36.0)
MCV: 92.9 fL (ref 80.0–100.0)
Monocytes Absolute: 0.4 10*3/uL (ref 0.1–1.0)
Monocytes Relative: 5 %
Neutro Abs: 4.9 10*3/uL (ref 1.7–7.7)
Neutrophils Relative %: 61 %
Platelets: 437 10*3/uL — ABNORMAL HIGH (ref 150–400)
RBC: 4.11 MIL/uL (ref 3.87–5.11)
RDW: 12.6 % (ref 11.5–15.5)
WBC: 8.1 10*3/uL (ref 4.0–10.5)
nRBC: 0 % (ref 0.0–0.2)

## 2022-02-18 LAB — LACTIC ACID, PLASMA: Lactic Acid, Venous: 1.5 mmol/L (ref 0.5–1.9)

## 2022-02-18 LAB — COMPREHENSIVE METABOLIC PANEL
ALT: 31 U/L (ref 0–44)
AST: 31 U/L (ref 15–41)
Albumin: 3.7 g/dL (ref 3.5–5.0)
Alkaline Phosphatase: 93 U/L (ref 38–126)
Anion gap: 9 (ref 5–15)
BUN: 12 mg/dL (ref 6–20)
CO2: 22 mmol/L (ref 22–32)
Calcium: 9.1 mg/dL (ref 8.9–10.3)
Chloride: 107 mmol/L (ref 98–111)
Creatinine, Ser: 1.11 mg/dL — ABNORMAL HIGH (ref 0.44–1.00)
GFR, Estimated: >60 mL/min (ref >60)
Glucose, Bld: 127 mg/dL — ABNORMAL HIGH (ref 70–99)
Potassium: 3.5 mmol/L (ref 3.5–5.1)
Sodium: 138 mmol/L (ref 135–145)
Total Bilirubin: 0.3 mg/dL (ref 0.3–1.2)
Total Protein: 7.5 g/dL (ref 6.5–8.1)

## 2022-02-18 LAB — I-STAT BETA HCG BLOOD, ED (MC, WL, AP ONLY): I-stat hCG, quantitative: <5 m[IU]/mL (ref <5)

## 2022-02-18 LAB — D-DIMER, QUANTITATIVE: D-Dimer, Quant: 0.52 ug/mL-FEU — ABNORMAL HIGH (ref 0.00–0.50)

## 2022-02-18 MED ORDER — KETOROLAC TROMETHAMINE 15 MG/ML IJ SOLN: 15.0000 mg | Freq: Once | INTRAMUSCULAR | Status: DC

## 2022-02-18 MED ORDER — PROCHLORPERAZINE EDISYLATE 10 MG/2ML IJ SOLN
10.0000 mg | Freq: Once | INTRAMUSCULAR | Status: AC
  Administered 2022-02-18: 10 mg via INTRAVENOUS
  Filled 2022-02-18: qty 2

## 2022-02-18 MED ORDER — IOHEXOL 300 MG/ML  SOLN
100.0000 mL | Freq: Once | INTRAMUSCULAR | Status: AC | PRN
  Administered 2022-02-18: 100 mL via INTRAVENOUS

## 2022-02-18 MED ORDER — SODIUM CHLORIDE (PF) 0.9 % IJ SOLN
INTRAMUSCULAR | Status: AC
  Filled 2022-02-18: qty 50

## 2022-02-18 NOTE — ED Notes (Signed)
EDP Cardama assessing pt in triage room 2 ?

## 2022-02-18 NOTE — ED Notes (Signed)
Patient said she was ready to leave and go home because the chairs are too uncomfortable here. RN told Dr. Leonette Monarch. ?

## 2022-02-18 NOTE — ED Triage Notes (Addendum)
Since Friday patient was last known normal. Her son said she has been stressed out a lot more lately. At first her chest and right arm began hurting. Along with neck and upper back pain. Complaining of headache that began this morning when she woke up at 7 am.Tonight patient started slurring her words. Patient has equal bilateral grip in both arms and legs. No facial droop.  ?

## 2022-02-18 NOTE — ED Notes (Signed)
RN was going to give patients pain medication. Patient not in room. Patient did not tell staff.  ?

## 2022-02-18 NOTE — ED Provider Notes (Signed)
?[facility] ?Provider Note ? ?CSN: [id number] ?Arrival date & time: 02/18/22 0140 ? ?Chief Complaint(s) ?Back Pain ? ?HPI ?Monique Hughes is a 41 y.o. female   ? ?The history is provided by the patient.  ?Back Pain ?Location:  Thoracic spine ?Quality:  Aching ?Radiates to:  R shoulder (chest) ?Pain severity:  Severe ?Onset quality:  Gradual ?Duration:  2 days ?Timing:  Constant ?Progression:  Worsening ?Chronicity:  New ?Context: emotional stress   ?Relieved by:  Nothing ?Worsened by:  Touching, twisting, movement and deep breathing ?Associated symptoms: chest pain and headaches   ?Associated symptoms: no fever, no numbness, no perianal numbness and no tingling   ? ?Tonight she is having difficulty speaking. No focal weakness. ? ?Son states that she has been under a lot of stress  ? ?Past Medical History ?Past Medical History:  ?Diagnosis Date  ? Anemia   ? iron/diet  ? Anxiety   ? Asthma   ? triggered by allergies  ? Bronchitis   ? Endometriosis   ? GERD (gastroesophageal reflux disease)   ? Headache   ? last 8/21  ? Heart murmur   ? mild  7 years ago no problems with surgery  ? No pertinent past medical history   ? Pneumonia   ? 2010  ? Reflux   ? ?There are no problems to display for this patient. ? ?Home Medication(s) ?Prior to Admission medications   ?Medication Sig Start Date End Date Taking? Authorizing Provider  ?albuterol (PROVENTIL HFA;VENTOLIN HFA) 108 (90 BASE) MCG/ACT inhaler Inhale 2 puffs into the lungs every 6 (six) hours as needed for wheezing or shortness of breath.     [provider]  ?ALPRAZolam Duanne Moron) 1 MG tablet Take 1 mg by mouth daily as needed for anxiety.     [provider]  ?Carboxymethylcellul-Glycerin (LUBRICATING EYE DROPS OP) Place 1 drop into both eyes daily as needed (dry eyes).    [provider]  ?Cyanocobalamin (B-12 PO) Take 1 tablet by mouth daily.    [provider]  ?cyclobenzaprine (FLEXERIL) 10 MG  tablet Take 10 mg by mouth 3 (three) times daily as needed for muscle spasms.    [provider]  ?ELDERBERRY PO Take 1 capsule by mouth 4 (four) times a week.     [provider]  ?EPINEPHrine 0.3 mg/0.3 mL IJ SOAJ injection Inject 0.3 mg into the muscle as needed for anaphylaxis. 07/03/20   [provider]  ?ketorolac (TORADOL) 10 MG tablet Take 10 mg by mouth daily as needed for moderate pain.    [provider]  ?mirabegron ER (MYRBETRIQ) 50 MG TB24 tablet Take 50 mg by mouth daily as needed (overactive bladder).     [provider]  ?Multiple Vitamins-Minerals (MULTIVITAMIN WITH MINERALS) tablet Take 1 tablet by mouth daily.      [provider]  ?oxyCODONE-acetaminophen (PERCOCET) 10-325 MG per tablet Take 1 tablet by mouth 3 (three) times daily as needed for pain.  06/14/15   [provider]  ?pantoprazole (PROTONIX) 40 MG tablet Take 40 mg by mouth daily.    [provider]  ?phenylephrine (SUDAFED PE) 10 MG TABS tablet Take 10 mg by mouth daily.    [provider]  ?promethazine (PHENERGAN) 25 MG tablet Take 12.5-25 mg by mouth every 8 (eight) hours as needed for nausea or vomiting.     [provider]  ?tolterodine (DETROL LA) 4 MG 24 hr capsule Take 4 mg  by mouth daily.    [provider]  ?valACYclovir (VALTREX) 500 MG tablet Take 500 mg by mouth daily. 08/05/20   [provider]  ?Vitamin D, Cholecalciferol, 10 MCG (400 UNIT) CAPS Take 400 Units by mouth daily.    [provider]  ?                                                                                                                                  ?Allergies ?Chocolate, Citric acid, Doxycycline, Grapefruit extract, Lemon juice, Orange fruit, Sulfa antibiotics, Tomato, and Tramadol ? ?Review of Systems ?Review of Systems  ?Constitutional:  Negative for fever.  ?Cardiovascular:  Positive for chest pain.  ?Musculoskeletal:  Positive  for back pain.  ?Neurological:  Positive for headaches. Negative for tingling and numbness.  ?As noted in HPI ? ?Physical Exam ?Vital Signs  ?I have reviewed the triage vital signs ?BP 133/90 (BP Location: Right Arm)   Pulse (!) 108   Temp 98.1 ?F (36.7 ?C) (Oral)   Resp 18   Ht 5' 3.5" (1.613 m)   Wt 76 kg   SpO2 96%   BMI 29.21 kg/m?  ? ?Physical Exam ?Vitals reviewed.  ?Constitutional:   ?   General: She is not in acute distress. ?   Appearance: She is well-developed. She is not diaphoretic.  ?HENT:  ?   Head: Normocephalic and atraumatic.  ?   Right Ear: External ear normal.  ?   Left Ear: External ear normal.  ?   Nose: Nose normal.  ?Eyes:  ?   General: No scleral icterus.    ?   Right eye: No discharge.     ?   Left eye: No discharge.  ?   Conjunctiva/sclera: Conjunctivae normal.  ?   Pupils: Pupils are equal, round, and reactive to light.  ?Neck:  ?   Trachea: Phonation normal.  ?Cardiovascular:  ?   Rate and Rhythm: Normal rate and regular rhythm.  ?   Heart sounds: No murmur heard. ?  No friction rub. No gallop.  ?Pulmonary:  ?   Effort: Pulmonary effort is normal. No respiratory distress.  ?   Breath sounds: Normal breath sounds. No stridor. No rales.  ?Abdominal:  ?   General: There is no distension.  ?   Palpations: Abdomen is soft.  ?   Tenderness: There is no abdominal tenderness.  ?Musculoskeletal:     ?   General: Normal range of motion.  ?   Cervical back: Normal range of motion and neck supple. Spasms and tenderness present. No bony tenderness.  ?     Back: ? ?Skin: ?   General: Skin is warm and dry.  ?   Findings: No erythema or rash.  ?Neurological:  ?   Mental Status: She is alert and oriented to person, place, and time.  ?   Comments: Mental Status:  ?Alert and oriented to person,  place, and time.  ?Attention and concentration normal.  ?Speech clear.  ?Recent memory is intact ? ?Cranial Nerves:  ?II Visual Fields: Intact to confrontation. Visual fields intact. ?III, IV, VI: Pupils equal  and reactive to light and near. Full eye movement without nystagmus  ?V Facial Sensation: Normal. No weakness of masticatory muscles  ?VII: No facial weakness or asymmetry  ?VIII Auditory Acuity: Grossly normal  ?IX/X: The uvula is midline; the palate elevates symmetrically  ?XI: Normal sternocleidomastoid and trapezius strength  ?XII: The tongue is midline. No atrophy or fasciculations.  ? ?Motor System: Muscle Strength: moves all extremities slowly but with good strength. Limited by effort ?Muscle Tone: Tone and muscle bulk are normal in the upper and lower extremities.  ?Coordination: No tremor.  ?Sensation: Intact to light touch  ?Gait: deferred ?  ?Psychiatric:     ?   Behavior: Behavior normal.  ? ? ?ED Results and Treatments ?Labs ?(all labs ordered are listed, but only abnormal results are displayed) ?Labs Reviewed  ?COMPREHENSIVE METABOLIC PANEL - Abnormal; Notable for the following components:  ?    Result Value  ? Glucose, Bld 127 (*)   ? Creatinine, Ser 1.11 (*)   ? All other components within normal limits  ?CBC WITH DIFFERENTIAL/PLATELET - Abnormal; Notable for the following components:  ? Platelets 437 (*)   ? All other components within normal limits  ?D-DIMER, QUANTITATIVE - Abnormal; Notable for the following components:  ? D-Dimer, Quant 0.52 (*)   ? All other components within normal limits  ?LACTIC ACID, PLASMA  ?URINALYSIS, ROUTINE W REFLEX MICROSCOPIC  ?RAPID URINE DRUG SCREEN, HOSP PERFORMED  ?I-STAT BETA HCG BLOOD, ED ([facility], [facility], [facility] ONLY)  ?                                                                                                                       ?EKG ? EKG Interpretation ? ?Date/Time:    ?Ventricular Rate:    ?PR Interval:    ?QRS Duration:   ?QT Interval:    ?QTC Calculation:   ?R Axis:     ?Text Interpretation:   ?  ? ?  ? ?Radiology ?CT Angio Chest/Abd/Pel for Dissection W and/or Wo Contrast ? ?Result Date: 02/18/2022 ?CLINICAL DATA:  Chest or back pain, aortic dissection  suspected. EXAM: CT ANGIOGRAPHY CHEST, ABDOMEN AND PELVIS TECHNIQUE: Non-contrast CT of the chest was initially obtained. Multidetector CT imaging through the chest, abdomen and pelvis was performed using the standard

## 2022-02-18 NOTE — ED Notes (Signed)
Tried to call pts cell phone and there was no answer.  Triage nurse, CN and MD are aware.  ?

## 2022-02-19 ENCOUNTER — Ambulatory Visit: Payer: Managed Care, Other (non HMO)
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
## 2022-02-20 ENCOUNTER — Ambulatory Visit: Payer: Managed Care, Other (non HMO) | Attending: Urology | Admitting: Physical Therapy

## 2022-02-20 DIAGNOSIS — M6281 Muscle weakness (generalized): Secondary | ICD-10-CM | POA: Diagnosis not present

## 2022-02-20 DIAGNOSIS — R3915 Urgency of urination: Secondary | ICD-10-CM | POA: Diagnosis not present

## 2022-02-20 DIAGNOSIS — M62838 Other muscle spasm: Secondary | ICD-10-CM | POA: Diagnosis not present

## 2022-02-20 DIAGNOSIS — R3982 Chronic bladder pain: Secondary | ICD-10-CM | POA: Insufficient documentation

## 2022-02-20 DIAGNOSIS — R102 Pelvic and perineal pain: Secondary | ICD-10-CM | POA: Insufficient documentation

## 2022-02-20 DIAGNOSIS — G8929 Other chronic pain: Secondary | ICD-10-CM | POA: Diagnosis not present

## 2022-03-07 ENCOUNTER — Encounter: Payer: Self-pay | Admitting: Physical Therapy

## 2022-03-07 ENCOUNTER — Ambulatory Visit: Payer: Commercial Managed Care - HMO | Attending: Urology | Admitting: Physical Therapy

## 2022-03-07 DIAGNOSIS — M62838 Other muscle spasm: Secondary | ICD-10-CM | POA: Insufficient documentation

## 2022-03-07 DIAGNOSIS — M6281 Muscle weakness (generalized): Secondary | ICD-10-CM | POA: Diagnosis present
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
## 2022-03-13 ENCOUNTER — Encounter: Payer: Self-pay | Admitting: Physical Therapy

## 2022-03-13 ENCOUNTER — Ambulatory Visit: Payer: Commercial Managed Care - HMO | Admitting: Physical Therapy

## 2022-03-13 DIAGNOSIS — M6281 Muscle weakness (generalized): Secondary | ICD-10-CM

## 2022-03-13 DIAGNOSIS — M62838 Other muscle spasm: Secondary | ICD-10-CM
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
## 2022-03-20 ENCOUNTER — Ambulatory Visit: Payer: Commercial Managed Care - HMO | Admitting: Physical Therapy

## 2022-03-20 ENCOUNTER — Encounter: Payer: Self-pay | Admitting: Physical Therapy

## 2022-03-20 DIAGNOSIS — M62838 Other muscle spasm: Secondary | ICD-10-CM

## 2022-03-20 DIAGNOSIS — M6281 Muscle weakness (generalized): Secondary | ICD-10-CM | POA: Diagnosis not present
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
## 2022-03-27 ENCOUNTER — Ambulatory Visit: Payer: Commercial Managed Care - HMO | Admitting: Physical Therapy
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
## 2022-04-10 ENCOUNTER — Encounter: Payer: Self-pay | Admitting: Physical Therapy

## 2022-04-10 ENCOUNTER — Ambulatory Visit: Payer: Commercial Managed Care - HMO | Attending: Urology | Admitting: Physical Therapy

## 2022-04-10 DIAGNOSIS — M6281 Muscle weakness (generalized): Secondary | ICD-10-CM | POA: Insufficient documentation

## 2022-04-10 DIAGNOSIS — M62838 Other muscle spasm: Secondary | ICD-10-CM | POA: Diagnosis present
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
## 2022-04-17 ENCOUNTER — Encounter: Payer: Self-pay | Admitting: Physical Therapy

## 2022-04-17 ENCOUNTER — Ambulatory Visit: Payer: Commercial Managed Care - HMO | Admitting: Physical Therapy

## 2022-04-17 DIAGNOSIS — M62838 Other muscle spasm: Secondary | ICD-10-CM

## 2022-04-17 DIAGNOSIS — M6281 Muscle weakness (generalized): Secondary | ICD-10-CM | POA: Diagnosis not present
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
## 2022-04-24 ENCOUNTER — Ambulatory Visit: Payer: Commercial Managed Care - HMO | Admitting: Physical Therapy

## 2022-04-24 ENCOUNTER — Encounter: Payer: Self-pay | Admitting: Physical Therapy

## 2022-04-24 DIAGNOSIS — M6281 Muscle weakness (generalized): Secondary | ICD-10-CM

## 2022-04-24 DIAGNOSIS — M62838 Other muscle spasm: Secondary | ICD-10-CM

## 2022-05-01 ENCOUNTER — Ambulatory Visit: Payer: Commercial Managed Care - HMO | Admitting: Physical Therapy

## 2022-05-01 ENCOUNTER — Encounter: Payer: Self-pay | Admitting: Physical Therapy

## 2022-05-01 DIAGNOSIS — M6281 Muscle weakness (generalized): Secondary | ICD-10-CM

## 2022-05-01 DIAGNOSIS — M62838 Other muscle spasm: Secondary | ICD-10-CM
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
## 2022-08-01 ENCOUNTER — Ambulatory Visit
Admission: RE | Admit: 2022-08-01 | Discharge: 2022-08-01 | Disposition: A | Discharge status: Home / Self Care | Payer: Commercial Managed Care - HMO | Source: Ambulatory Visit | Attending: Family Medicine | Admitting: Family Medicine

## 2022-08-01 ENCOUNTER — Other Ambulatory Visit: Payer: Self-pay | Admitting: Family Medicine

## 2022-08-01 DIAGNOSIS — R059 Cough, unspecified: Secondary | ICD-10-CM

## 2023-01-09 DIAGNOSIS — R1032 Left lower quadrant pain: Secondary | ICD-10-CM | POA: Diagnosis not present

## 2023-01-30 DIAGNOSIS — M542 Cervicalgia: Secondary | ICD-10-CM | POA: Diagnosis not present

## 2023-01-30 DIAGNOSIS — M62838 Other muscle spasm: Secondary | ICD-10-CM | POA: Diagnosis not present

## 2023-01-30 DIAGNOSIS — Z6828 Body mass index (BMI) 28.0-28.9, adult: Secondary | ICD-10-CM | POA: Diagnosis not present

## 2023-02-03 DIAGNOSIS — L739 Follicular disorder, unspecified: Secondary | ICD-10-CM | POA: Diagnosis not present

## 2023-02-03 DIAGNOSIS — M545 Low back pain, unspecified: Secondary | ICD-10-CM | POA: Diagnosis not present

## 2023-02-04 DIAGNOSIS — M797 Fibromyalgia: Secondary | ICD-10-CM | POA: Diagnosis not present

## 2023-02-04 DIAGNOSIS — N3010 Interstitial cystitis (chronic) without hematuria: Secondary | ICD-10-CM | POA: Diagnosis not present

## 2023-02-04 DIAGNOSIS — Z79891 Long term (current) use of opiate analgesic: Secondary | ICD-10-CM | POA: Diagnosis not present

## 2023-02-04 DIAGNOSIS — N809 Endometriosis, unspecified: Secondary | ICD-10-CM | POA: Diagnosis not present

## 2023-02-13 DIAGNOSIS — N83202 Unspecified ovarian cyst, left side: Secondary | ICD-10-CM | POA: Diagnosis not present

## 2023-05-14 ENCOUNTER — Ambulatory Visit: Payer: Commercial Managed Care - HMO | Attending: Urology | Admitting: Physical Therapy

## 2023-05-14 NOTE — Therapy (Deleted)
OUTPATIENT PHYSICAL THERAPY FEMALE PELVIC EVALUATION   Patient Name: Monique Hughes MRN: [id number] DOB:1981/03/15, 42 y.o., female Today's Date: 05/14/2023  END OF SESSION:   Past Medical History:  Diagnosis Date   Anemia    iron/diet   Anxiety    Asthma    triggered by allergies   Bronchitis    Endometriosis    GERD (gastroesophageal reflux disease)    Headache    last 8/21   Heart murmur    mild  7 years ago no problems with surgery   No pertinent past medical history    Pneumonia    2010   Reflux    Past Surgical History:  Procedure Laterality Date   ABLATION ON ENDOMETRIOSIS  2016   CERVIX LESION DESTRUCTION  2004   CYSTOSCOPY WITH HYDRODISTENSION AND BIOPSY N/A 08/22/2020   Procedure: CYSTOSCOPY/BIOPSY/HYDRODISTENSION;  Surgeon: Noel Christmas, MD;  Location: [facility];  Service: Urology;  Laterality: N/A;  45 MINS   DIAGNOSTIC LAPAROSCOPY  2020   Myomectomy   laproscopy  2003   cyst removal from ovary   OOPHORECTOMY  2010   rt overy   There are no problems to display for this patient.   PCP: ***  REFERRING PROVIDER: ***  REFERRING DIAG: ***  THERAPY DIAG:  No diagnosis found.  Rationale for Evaluation and Treatment: {HABREHAB:27488}  ONSET DATE: ***  SUBJECTIVE:                                                                                                                                                                                           SUBJECTIVE STATEMENT: *** Fluid intake: {Yes/No:304960894}   PAIN:  Are you having pain? {yes/no:20286} NPRS scale: ***/10 Pain location: {pelvic pain location:27098}  Pain type: {type:313116} Pain description: {PAIN DESCRIPTION:21022940}   Aggravating factors: *** Relieving factors: ***  PRECAUTIONS: {Therapy precautions:24002}  WEIGHT BEARING RESTRICTIONS: {Yes ***/No:24003}  FALLS:  Has patient fallen in last 6 months? {fallsyesno:27318}  LIVING ENVIRONMENT: Lives with: {[facility] lives  with:25569::"lives with their family"} Lives in: {Lives in:25570} Stairs: {opstairs:27293} Has following equipment at home: {Assistive devices:23999}  OCCUPATION: ***  PLOF: {PLOF:24004}  PATIENT GOALS: ***  PERTINENT HISTORY:  *** Sexual abuse: {Yes/No:304960894}  BOWEL MOVEMENT: Pain with bowel movement: {yes/no:20286} Type of bowel movement:{PT BM type:27100} Fully empty rectum: {Yes/No:304960894} Leakage: {Yes/No:304960894} Pads: {Yes/No:304960894} Fiber supplement: {Yes/No:304960894}  URINATION: Pain with urination: {yes/no:20286} Fully empty bladder: {Yes/No:304960894} Stream: {PT urination:27102} Urgency: {Yes/No:304960894} Frequency: *** Leakage: {PT leakage:27103} Pads: {Yes/No:304960894}  INTERCOURSE: Pain with intercourse: {pain with intercourse PA:27099} Ability to have vaginal penetration:  {Yes/No:304960894} Climax: *** Marinoff Scale: ***/3  PREGNANCY: Vaginal deliveries ***  Tearing {Yes***/No:304960894} C-section deliveries *** Currently pregnant {Yes***/No:304960894}  PROLAPSE: {PT prolapse:27101}   OBJECTIVE:   DIAGNOSTIC FINDINGS:  ***  PATIENT SURVEYS:  {rehab surveys:24030}  PFIQ-7 ***  COGNITION: Overall cognitive status: {cognition:24006}     SENSATION: Light touch: {intact/deficits:24005} Proprioception: {intact/deficits:24005}  MUSCLE LENGTH: Hamstrings: Right *** deg; Left *** deg Thomas test: Right *** deg; Left *** deg  LUMBAR SPECIAL TESTS:  {lumbar special test:25242}  FUNCTIONAL TESTS:  {Functional tests:24029}  GAIT: Distance walked: *** Assistive device utilized: {Assistive devices:23999} Level of assistance: {Levels of assistance:24026} Comments: ***  POSTURE: {posture:25561}  PELVIC ALIGNMENT:  LUMBARAROM/PROM:  A/PROM A/PROM  eval  Flexion   Extension   Right lateral flexion   Left lateral flexion   Right rotation   Left rotation    (Blank rows = not tested)  LOWER EXTREMITY  ROM:  {AROM/PROM:27142} ROM Right eval Left eval  Hip flexion    Hip extension    Hip abduction    Hip adduction    Hip internal rotation    Hip external rotation    Knee flexion    Knee extension    Ankle dorsiflexion    Ankle plantarflexion    Ankle inversion    Ankle eversion     (Blank rows = not tested)  LOWER EXTREMITY MMT:  MMT Right eval Left eval  Hip flexion    Hip extension    Hip abduction    Hip adduction    Hip internal rotation    Hip external rotation    Knee flexion    Knee extension    Ankle dorsiflexion    Ankle plantarflexion    Ankle inversion    Ankle eversion     PALPATION:   General  ***                External Perineal Exam ***                             Internal Pelvic Floor ***  Patient confirms identification and approves PT to assess internal pelvic floor and treatment {yes/no:20286}  PELVIC MMT:   MMT eval  Vaginal   Internal Anal Sphincter   External Anal Sphincter   Puborectalis   Diastasis Recti   (Blank rows = not tested)        TONE: ***  PROLAPSE: ***  TODAY'S TREATMENT:                                                                                                                              DATE: ***  EVAL ***   PATIENT EDUCATION:  Education details: *** Person educated: {Person educated:25204} Education method: {Education Method:25205} Education comprehension: {Education Comprehension:25206}  HOME EXERCISE PROGRAM: ***  ASSESSMENT:  CLINICAL IMPRESSION: Patient is a *** y.o. *** who was seen today for physical therapy evaluation and treatment for ***.   OBJECTIVE IMPAIRMENTS: {opptimpairments:25111}.  ACTIVITY LIMITATIONS: {activitylimitations:27494}  PARTICIPATION LIMITATIONS: {participationrestrictions:25113}  PERSONAL FACTORS: {Personal factors:25162} are also affecting patient's functional outcome.   REHAB POTENTIAL: {rehabpotential:25112}  CLINICAL DECISION MAKING: {clinical  decision making:25114}  EVALUATION COMPLEXITY: {Evaluation complexity:25115}   GOALS: Goals reviewed with patient? {yes/no:20286}  SHORT TERM GOALS: Target date: ***  *** Baseline: Goal status: INITIAL  2.  *** Baseline:  Goal status: INITIAL  3.  *** Baseline:  Goal status: INITIAL  4.  *** Baseline:  Goal status: INITIAL  5.  *** Baseline:  Goal status: INITIAL  6.  *** Baseline:  Goal status: INITIAL  LONG TERM GOALS: Target date: ***  *** Baseline:  Goal status: INITIAL  2.  *** Baseline:  Goal status: INITIAL  3.  *** Baseline:  Goal status: INITIAL  4.  *** Baseline:  Goal status: INITIAL  5.  *** Baseline:  Goal status: INITIAL  6.  *** Baseline:  Goal status: INITIAL  PLAN:  PT FREQUENCY: {rehab frequency:25116}  PT DURATION: {rehab duration:25117}  PLANNED INTERVENTIONS: {rehab planned interventions:25118::"Therapeutic exercises","Therapeutic activity","Neuromuscular re-education","Balance training","Gait training","Patient/Family education","Self Care","Joint mobilization"}  PLAN FOR NEXT SESSION: ***   Brayton Caves Maame Dack, PT 05/14/2023, 7:51 AM

## 2023-05-30 ENCOUNTER — Ambulatory Visit: Payer: Commercial Managed Care - HMO | Admitting: Physical Therapy

## 2023-06-23 ENCOUNTER — Ambulatory Visit: Payer: Commercial Managed Care - HMO

## 2024-04-19 ENCOUNTER — Encounter (HOSPITAL_COMMUNITY): Payer: Self-pay

## 2024-04-19 ENCOUNTER — Emergency Department (HOSPITAL_COMMUNITY)
Admission: EM | Admit: 2024-04-19 | Discharge: 2024-04-20 | Disposition: A | Discharge status: Home / Self Care | Attending: Emergency Medicine | Admitting: Emergency Medicine

## 2024-04-19 ENCOUNTER — Other Ambulatory Visit: Payer: Self-pay

## 2024-04-19 DIAGNOSIS — G43909 Migraine, unspecified, not intractable, without status migrainosus: Secondary | ICD-10-CM | POA: Insufficient documentation

## 2024-04-19 DIAGNOSIS — D75839 Thrombocytosis, unspecified: Secondary | ICD-10-CM | POA: Insufficient documentation

## 2024-04-19 DIAGNOSIS — D649 Anemia, unspecified: Secondary | ICD-10-CM | POA: Insufficient documentation

## 2024-04-19 DIAGNOSIS — R0789 Other chest pain: Secondary | ICD-10-CM | POA: Diagnosis not present

## 2024-04-19 DIAGNOSIS — J45909 Unspecified asthma, uncomplicated: Secondary | ICD-10-CM | POA: Insufficient documentation

## 2024-04-19 NOTE — ED Triage Notes (Signed)
 Pt reports that she has had a migraine x 1 day. Pt reports that she also has right sided chest pressure that started at the same time. Pt reports that she has had increased stress recently and thinks that may have something to do with it.

## 2024-04-20 ENCOUNTER — Emergency Department (HOSPITAL_COMMUNITY)

## 2024-04-20 LAB — CBC
HCT: 36.7 % (ref 36.0–46.0)
Hemoglobin: 11.9 g/dL — ABNORMAL LOW (ref 12.0–15.0)
MCH: 32.2 pg (ref 26.0–34.0)
MCHC: 32.4 g/dL (ref 30.0–36.0)
MCV: 99.2 fL (ref 80.0–100.0)
Platelets: 417 10*3/uL — ABNORMAL HIGH (ref 150–400)
RBC: 3.7 MIL/uL — ABNORMAL LOW (ref 3.87–5.11)
RDW: 11.9 % (ref 11.5–15.5)
WBC: 8.9 10*3/uL (ref 4.0–10.5)
nRBC: 0 % (ref 0.0–0.2)

## 2024-04-20 LAB — BASIC METABOLIC PANEL WITH GFR
Anion gap: 11 (ref 5–15)
BUN: 14 mg/dL (ref 6–20)
CO2: 23 mmol/L (ref 22–32)
Calcium: 8.6 mg/dL — ABNORMAL LOW (ref 8.9–10.3)
Chloride: 106 mmol/L (ref 98–111)
Creatinine, Ser: 0.81 mg/dL (ref 0.44–1.00)
GFR, Estimated: >60 mL/min (ref >60)
Glucose, Bld: 99 mg/dL (ref 70–99)
Potassium: 3.8 mmol/L (ref 3.5–5.1)
Sodium: 140 mmol/L (ref 135–145)

## 2024-04-20 LAB — TROPONIN I (HIGH SENSITIVITY): Troponin I (High Sensitivity): 2 ng/L (ref <18)

## 2024-04-20 LAB — HCG, SERUM, QUALITATIVE: Preg, Serum: NEGATIVE

## 2024-04-20 MED ORDER — KETOROLAC TROMETHAMINE 15 MG/ML IJ SOLN
15.0000 mg | Freq: Once | INTRAMUSCULAR | Status: AC
  Administered 2024-04-20: 15 mg via INTRAVENOUS
  Filled 2024-04-20: qty 1

## 2024-04-20 MED ORDER — METOCLOPRAMIDE HCL 5 MG/ML IJ SOLN
10.0000 mg | Freq: Once | INTRAMUSCULAR | Status: AC
  Administered 2024-04-20: 10 mg via INTRAVENOUS
  Filled 2024-04-20: qty 2

## 2024-04-20 MED ORDER — DEXAMETHASONE SODIUM PHOSPHATE 10 MG/ML IJ SOLN
10.0000 mg | Freq: Once | INTRAMUSCULAR | Status: AC
  Administered 2024-04-20: 10 mg via INTRAVENOUS
  Filled 2024-04-20: qty 1

## 2024-04-20 MED ORDER — DIPHENHYDRAMINE HCL 50 MG/ML IJ SOLN
25.0000 mg | Freq: Once | INTRAMUSCULAR | Status: AC
  Administered 2024-04-20: 25 mg via INTRAVENOUS
  Filled 2024-04-20: qty 1

## 2024-04-20 MED ORDER — SODIUM CHLORIDE 0.9 % IV BOLUS
1000.0000 mL | Freq: Once | INTRAVENOUS | Status: AC
  Administered 2024-04-20: 1000 mL via INTRAVENOUS

## 2024-04-20 NOTE — ED Provider Notes (Signed)
 [place] EMERGENCY DEPARTMENT AT [facility] Provider Note   CSN: [id number] Arrival date & time: 04/19/24  2326     Patient presents with: Migraine   Monique Hughes is a 43 y.o. female with past medical history significant for GERD, anxiety, asthma, no significant history of hypertension, hyperlipidemia, diabetes, denies tobacco abuse who presents with 2 complaints today.  She reports that she has had a migraine for 1 day that is slightly worse than her normal.  She endorses light sensitivity, denies significant nausea.  She also endorses some right sided chest pain intermittently for the last week, worse today.  She denies any shortness of breath, she denies any exertional chest pain.  She reports that she has been under a lot of stress recently.  No previous history of ACS.    Migraine       Prior to Admission medications   Medication Sig Start Date End Date Taking? Authorizing Provider  albuterol  (PROVENTIL  HFA;VENTOLIN  HFA) 108 (90 BASE) MCG/ACT inhaler Inhale 2 puffs into the lungs every 6 (six) hours as needed for wheezing or shortness of breath.     [provider]  ALPRAZolam (XANAX) 1 MG tablet Take 1 mg by mouth daily as needed for anxiety.     [provider]  Carboxymethylcellul-Glycerin (LUBRICATING EYE DROPS OP) Place 1 drop into both eyes daily as needed (dry eyes).    [provider]  Cyanocobalamin (B-12 PO) Take 1 tablet by mouth daily.    [provider]  cyclobenzaprine  (FLEXERIL ) 10 MG tablet Take 10 mg by mouth 3 (three) times daily as needed for muscle spasms.    [provider]  ELDERBERRY PO Take 1 capsule by mouth 4 (four) times a week.     [provider]  EPINEPHrine 0.3 mg/0.3 mL IJ SOAJ injection Inject 0.3 mg into the muscle as needed for anaphylaxis. 07/03/20   [provider]  ketorolac  (TORADOL ) 10 MG tablet Take 10 mg by mouth daily as needed for moderate pain.    [provider]  mirabegron ER (MYRBETRIQ) 50 MG TB24 tablet Take 50 mg by mouth daily as needed (overactive bladder).     [provider]  Multiple Vitamins-Minerals (MULTIVITAMIN WITH MINERALS) tablet Take 1 tablet by mouth daily.      [provider]  oxyCODONE -acetaminophen  (PERCOCET) 10-325 MG per tablet Take 1 tablet by mouth 3 (three) times daily as needed for pain.  06/14/15   [provider]  pantoprazole (PROTONIX) 40 MG tablet Take 40 mg by mouth daily.    [provider]  phenylephrine (SUDAFED PE) 10 MG TABS tablet Take 10 mg by mouth daily.    [provider]  promethazine (PHENERGAN) 25 MG tablet Take 12.5-25 mg by mouth every 8 (eight) hours as needed for nausea or vomiting.     [provider]  tolterodine (DETROL LA) 4 MG 24 hr capsule Take 4 mg by mouth daily.    [provider]  valACYclovir (VALTREX) 500 MG tablet Take 500 mg by mouth daily. 08/05/20   [provider]  Vitamin D, Cholecalciferol, 10 MCG (400 UNIT) CAPS Take 400 Units by mouth daily.    [provider]    Allergies: Chocolate, Citric acid, Doxycycline, Grapefruit extract, Lemon juice, Orange fruit, Sulfa  antibiotics, Tomato, and Tramadol    Review of Systems  All other systems reviewed and are negative.   Updated Vital Signs BP 124/89 (BP Location: Right Arm)  Pulse 85   Temp 97.7 F (36.5 C) (Oral)   Resp 18   LMP 04/17/2024 (Exact Date)   SpO2 99%   Physical Exam Vitals and nursing note reviewed.  Constitutional:      General: She is not in acute distress.    Appearance: Normal appearance.  HENT:     Head: Normocephalic and atraumatic.   Eyes:     General:        Right eye: No discharge.        Left eye: No discharge.    Cardiovascular:     Rate and Rhythm: Normal rate and regular rhythm.     Heart sounds: No murmur heard.    No friction rub. No gallop.  Pulmonary:     Effort: Pulmonary effort is normal.      Breath sounds: Normal breath sounds.     Comments: No wheezing, rhonchi, rales Abdominal:     General: Bowel sounds are normal.     Palpations: Abdomen is soft.   Skin:    General: Skin is warm and dry.     Capillary Refill: Capillary refill takes less than 2 seconds.   Neurological:     Mental Status: She is alert and oriented to person, place, and time.     Comments: Cranial nerves II through XII grossly intact.  Intact finger-nose, intact heel-to-shin.  Romberg negative, gait normal.  Alert and oriented x3.  Moves all 4 limbs spontaneously, normal coordination.  No pronator drift.  Intact strength 5 out of 5 bilateral upper and lower extremities.    Psychiatric:        Mood and Affect: Mood normal.        Behavior: Behavior normal.     (all labs ordered are listed, but only abnormal results are displayed) Labs Reviewed  BASIC METABOLIC PANEL WITH GFR - Abnormal; Notable for the following components:      Result Value   Calcium 8.6 (*)    All other components within normal limits  CBC - Abnormal; Notable for the following components:   RBC 3.70 (*)    Hemoglobin 11.9 (*)    Platelets 417 (*)    All other components within normal limits  HCG, SERUM, QUALITATIVE  TROPONIN I (HIGH SENSITIVITY)    EKG: None  Radiology: DG Chest 2 View Result Date: 04/20/2024 CLINICAL DATA:  Right-sided chest pain. EXAM: CHEST - 2 VIEW COMPARISON:  August 01, 2022 FINDINGS: The heart size and mediastinal contours are within normal limits. Both lungs are clear. The visualized skeletal structures are unremarkable. IMPRESSION: No active cardiopulmonary disease. Electronically Signed   By: Virgle Grime M.D.   On: 04/20/2024 00:45     Procedures   Medications Ordered in the ED  sodium chloride  0.9 % bolus 1,000 mL (0 mLs Intravenous Stopped 04/20/24 0204)  metoCLOPramide (REGLAN) injection 10 mg (10 mg Intravenous Given 04/20/24 0044)  diphenhydrAMINE  (BENADRYL ) injection 25 mg  (25 mg Intravenous Given 04/20/24 0044)  ketorolac  (TORADOL ) 15 MG/ML injection 15 mg (15 mg Intravenous Given 04/20/24 0044)  dexamethasone  (DECADRON ) injection 10 mg (10 mg Intravenous Given 04/20/24 0206)                                    Medical Decision Making Amount and/or Complexity of Data Reviewed Labs: ordered. Radiology: ordered.  Risk Prescription drug management.   This patient is a 44 y.o. female  who presents to the ED for concern of headache, chest pain.   Differential diagnoses prior to evaluation: The emergent differential diagnosis includes, but is not limited to,  Stroke, increased ICP, meningitis, CVA, intracranial tumor, venous sinus thrombosis, migraine, cluster headache, hypertension, drug related, head injury, tension headache, sinusitis, dental abscess, otitis media, TMJ -- ACS, AAS, PE, Mallory-Weiss, Boerhaave's, Pneumonia, acute bronchitis, asthma or COPD exacerbation, anxiety, MSK pain or traumatic injury to the chest, acid reflux versus other. This is not an exhaustive differential.   Past Medical History / Co-morbidities / Social History: GERD, anxiety, asthma, no significant history of hypertension, hyperlipidemia, diabetes, denies tobacco abuse  Physical Exam: Physical exam performed. The pertinent findings include: Cranial nerves II through XII grossly intact.  Intact finger-nose, intact heel-to-shin.  Romberg negative, gait normal.  Alert and oriented x3.  Moves all 4 limbs spontaneously, normal coordination.  No pronator drift.  Intact strength 5 out of 5 bilateral upper and lower extremities.  No wheezing, rhonchi, rales  Mild hypertension on arrival, blood pressure 138/97, improved on reassessment, 124/89.  Vital signs otherwise stable.  Lab Tests/Imaging studies: I personally interpreted labs/imaging and the pertinent results include: Troponin negative x 1 in context of atypical chest pain, intermittently for a week.  Negative serum pregnancy  test, unremarkable BMP, CBC with mild anemia hemoglobin 11.9, platelets very mildly elevated at 417, may be secondary to acute phase reaction from her current migraine.  I independently interpreted plain film chest x-ray shows no evidence of acute intrathoracic abnormality.. I agree with the radiologist interpretation.  Cardiac monitoring: EKG obtained and interpreted by myself and attending physician which shows: Normal sinus rhythm, no acute ST-T changes   Medications: I ordered medication including migraine cocktail, on reassessment patient reports headache is improved..  I have reviewed the patients home medicines and have made adjustments as needed.   Disposition: After consideration of the diagnostic results and the patients response to treatment, I feel that I suspect that her chest pain is secondary to stress as she reports significant increase in anxiety, her presentation is atypical, it is sharp, right-sided, not exertional.  Considering she is feeling improved, symptoms are consistent with migraine, heart score of 0 I think that she is stable for discharge, PCP follow-up recommended as needed. Return precautions given  emergency department workup does not suggest an emergent condition requiring admission or immediate intervention beyond what has been performed at this time. The plan is: as above. The patient is safe for discharge and has been instructed to return immediately for worsening symptoms, change in symptoms or any other concerns.   Final diagnoses:  Migraine without status migrainosus, not intractable, unspecified migraine type  Atypical chest pain    ED Discharge Orders     None          Nelly Banco, PA-C 04/20/24 0207    Albertus Hughs, DO 04/20/24 0208

## 2024-04-20 NOTE — Discharge Instructions (Signed)
 As we discussed your workup today was reassuring, I suspect that your headache was in fact a migraine, please drink plenty of fluids over the next few days, return if you have significantly worsening headache despite treatment.

## 2024-04-20 NOTE — ED Provider Notes (Incomplete)
 [name] EMERGENCY DEPARTMENT AT [facility] Provider Note   CSN: [id number] Arrival date & time: 04/19/24  2326     Patient presents with: Migraine   Monique Hughes is a 43 y.o. female with past medical history significant for GERD, anxiety, asthma, no significant history of hypertension, hyperlipidemia, diabetes, denies tobacco abuse who presents with 2 complaints today.  She reports that she has had a migraine for 1 day that is slightly worse than her normal.  She endorses light sensitivity, denies significant nausea.  She also endorses some right sided chest pain intermittently for the last week, worse today.  She denies any shortness of breath, she denies any exertional chest pain.  She reports that she has been under a lot of stress recently.  No previous history of ACS.  {Add pertinent medical, surgical, social history, OB history to HPI:32947}  Migraine       Prior to Admission medications   Medication Sig Start Date End Date Taking? Authorizing Provider  albuterol  (PROVENTIL  HFA;VENTOLIN  HFA) 108 (90 BASE) MCG/ACT inhaler Inhale 2 puffs into the lungs every 6 (six) hours as needed for wheezing or shortness of breath.     [provider]  ALPRAZolam (XANAX) 1 MG tablet Take 1 mg by mouth daily as needed for anxiety.     [provider]  Carboxymethylcellul-Glycerin (LUBRICATING EYE DROPS OP) Place 1 drop into both eyes daily as needed (dry eyes).    [provider]  Cyanocobalamin (B-12 PO) Take 1 tablet by mouth daily.    [provider]  cyclobenzaprine  (FLEXERIL ) 10 MG tablet Take 10 mg by mouth 3 (three) times daily as needed for muscle spasms.    [provider]  ELDERBERRY PO Take 1 capsule by mouth 4 (four) times a week.     [provider]  EPINEPHrine 0.3 mg/0.3 mL IJ SOAJ injection Inject 0.3 mg into the muscle as needed for anaphylaxis. 07/03/20   [provider]  ketorolac  (TORADOL ) 10 MG  tablet Take 10 mg by mouth daily as needed for moderate pain.    [provider]  mirabegron ER (MYRBETRIQ) 50 MG TB24 tablet Take 50 mg by mouth daily as needed (overactive bladder).     [provider]  Multiple Vitamins-Minerals (MULTIVITAMIN WITH MINERALS) tablet Take 1 tablet by mouth daily.      [provider]  oxyCODONE -acetaminophen  (PERCOCET) 10-325 MG per tablet Take 1 tablet by mouth 3 (three) times daily as needed for pain.  06/14/15   [provider]  pantoprazole (PROTONIX) 40 MG tablet Take 40 mg by mouth daily.    [provider]  phenylephrine (SUDAFED PE) 10 MG TABS tablet Take 10 mg by mouth daily.    [provider]  promethazine (PHENERGAN) 25 MG tablet Take 12.5-25 mg by mouth every 8 (eight) hours as needed for nausea or vomiting.     [provider]  tolterodine (DETROL LA) 4 MG 24 hr capsule Take 4 mg by mouth daily.    [provider]  valACYclovir (VALTREX) 500 MG tablet Take 500 mg by mouth daily. 08/05/20   [provider]  Vitamin D, Cholecalciferol, 10 MCG (400 UNIT) CAPS Take 400 Units by mouth daily.    [provider]    Allergies: Chocolate, Citric acid, Doxycycline, Grapefruit extract, Lemon juice, Orange fruit, Sulfa  antibiotics, Tomato, and Tramadol    Review of Systems  All other systems reviewed and are negative.   Updated Vital  Signs BP 124/89 (BP Location: Right Arm)   Pulse 85   Temp 97.7 F (36.5 C) (Oral)   Resp 18   LMP 04/17/2024 (Exact Date)   SpO2 99%   Physical Exam Vitals and nursing note reviewed.  Constitutional:      General: She is not in acute distress.    Appearance: Normal appearance.  HENT:     Head: Normocephalic and atraumatic.   Eyes:     General:        Right eye: No discharge.        Left eye: No discharge.    Cardiovascular:     Rate and Rhythm: Normal rate and regular rhythm.     Heart sounds: No murmur heard.    No  friction rub. No gallop.  Pulmonary:     Effort: Pulmonary effort is normal.     Breath sounds: Normal breath sounds.  Abdominal:     General: Bowel sounds are normal.     Palpations: Abdomen is soft.   Skin:    General: Skin is warm and dry.     Capillary Refill: Capillary refill takes less than 2 seconds.   Neurological:     Mental Status: She is alert and oriented to person, place, and time.     Comments: Cranial nerves II through XII grossly intact.  Intact finger-nose, intact heel-to-shin.  Romberg negative, gait normal.  Alert and oriented x3.  Moves all 4 limbs spontaneously, normal coordination.  No pronator drift.  Intact strength 5 out of 5 bilateral upper and lower extremities.    Psychiatric:        Mood and Affect: Mood normal.        Behavior: Behavior normal.     (all labs ordered are listed, but only abnormal results are displayed) Labs Reviewed - No data to display  EKG: None  Radiology: No results found.  {Document cardiac monitor, telemetry assessment procedure when appropriate:32947} Procedures   Medications Ordered in the ED  sodium chloride  0.9 % bolus 1,000 mL (has no administration in time range)  metoCLOPramide (REGLAN) injection 10 mg (has no administration in time range)  diphenhydrAMINE  (BENADRYL ) injection 25 mg (has no administration in time range)  ketorolac  (TORADOL ) 15 MG/ML injection 15 mg (has no administration in time range)      {Click here for ABCD2, HEART and other calculators REFRESH Note before signing:1}                              Medical Decision Making Amount and/or Complexity of Data Reviewed Labs: ordered. Radiology: ordered.  Risk Prescription drug management.   ***  {Document critical care time when appropriate  Document review of labs and clinical decision tools ie CHADS2VASC2, etc  Document your independent review of radiology images and any outside records  Document your discussion with family members,  caretakers and with consultants  Document social determinants of health affecting pt's care  Document your decision making why or why not admission, treatments were needed:32947:::1}   Final diagnoses:  None    ED Discharge Orders     None

## 2024-04-22 ENCOUNTER — Emergency Department (HOSPITAL_BASED_OUTPATIENT_CLINIC_OR_DEPARTMENT_OTHER)

## 2024-04-22 ENCOUNTER — Emergency Department (HOSPITAL_BASED_OUTPATIENT_CLINIC_OR_DEPARTMENT_OTHER)
Admission: EM | Admit: 2024-04-22 | Discharge: 2024-04-22 | Disposition: A | Discharge status: Home / Self Care | Attending: Emergency Medicine | Admitting: Emergency Medicine

## 2024-04-22 ENCOUNTER — Other Ambulatory Visit: Payer: Self-pay

## 2024-04-22 ENCOUNTER — Encounter (HOSPITAL_BASED_OUTPATIENT_CLINIC_OR_DEPARTMENT_OTHER): Payer: Self-pay | Admitting: Emergency Medicine

## 2024-04-22 DIAGNOSIS — J45909 Unspecified asthma, uncomplicated: Secondary | ICD-10-CM | POA: Diagnosis not present

## 2024-04-22 DIAGNOSIS — Z3202 Encounter for pregnancy test, result negative: Secondary | ICD-10-CM | POA: Diagnosis not present

## 2024-04-22 DIAGNOSIS — D649 Anemia, unspecified: Secondary | ICD-10-CM | POA: Diagnosis not present

## 2024-04-22 DIAGNOSIS — Z79899 Other long term (current) drug therapy: Secondary | ICD-10-CM | POA: Insufficient documentation

## 2024-04-22 DIAGNOSIS — R2243 Localized swelling, mass and lump, lower limb, bilateral: Secondary | ICD-10-CM | POA: Insufficient documentation

## 2024-04-22 DIAGNOSIS — D473 Essential (hemorrhagic) thrombocythemia: Secondary | ICD-10-CM | POA: Diagnosis not present

## 2024-04-22 DIAGNOSIS — M7989 Other specified soft tissue disorders: Secondary | ICD-10-CM

## 2024-04-22 DIAGNOSIS — D72829 Elevated white blood cell count, unspecified: Secondary | ICD-10-CM | POA: Insufficient documentation

## 2024-04-22 DIAGNOSIS — I10 Essential (primary) hypertension: Secondary | ICD-10-CM | POA: Insufficient documentation

## 2024-04-22 LAB — CBC WITH DIFFERENTIAL/PLATELET
Abs Immature Granulocytes: 0.05 10*3/uL (ref 0.00–0.07)
Basophils Absolute: 0 10*3/uL (ref 0.0–0.1)
Basophils Relative: 0 %
Eosinophils Absolute: 0.2 10*3/uL (ref 0.0–0.5)
Eosinophils Relative: 2 %
HCT: 35.4 % — ABNORMAL LOW (ref 36.0–46.0)
Hemoglobin: 11.9 g/dL — ABNORMAL LOW (ref 12.0–15.0)
Immature Granulocytes: 0 %
Lymphocytes Relative: 20 %
Lymphs Abs: 2.4 10*3/uL (ref 0.7–4.0)
MCH: 31.9 pg (ref 26.0–34.0)
MCHC: 33.6 g/dL (ref 30.0–36.0)
MCV: 94.9 fL (ref 80.0–100.0)
Monocytes Absolute: 0.6 10*3/uL (ref 0.1–1.0)
Monocytes Relative: 5 %
Neutro Abs: 9 10*3/uL — ABNORMAL HIGH (ref 1.7–7.7)
Neutrophils Relative %: 73 %
Platelets: 432 10*3/uL — ABNORMAL HIGH (ref 150–400)
RBC: 3.73 MIL/uL — ABNORMAL LOW (ref 3.87–5.11)
RDW: 12.2 % (ref 11.5–15.5)
WBC: 12.3 10*3/uL — ABNORMAL HIGH (ref 4.0–10.5)
nRBC: 0 % (ref 0.0–0.2)

## 2024-04-22 LAB — COMPREHENSIVE METABOLIC PANEL WITH GFR
ALT: 34 U/L (ref 0–44)
AST: 26 U/L (ref 15–41)
Albumin: 4.3 g/dL (ref 3.5–5.0)
Alkaline Phosphatase: 105 U/L (ref 38–126)
Anion gap: 12 (ref 5–15)
BUN: 13 mg/dL (ref 6–20)
CO2: 26 mmol/L (ref 22–32)
Calcium: 9.2 mg/dL (ref 8.9–10.3)
Chloride: 102 mmol/L (ref 98–111)
Creatinine, Ser: 0.83 mg/dL (ref 0.44–1.00)
GFR, Estimated: >60 mL/min (ref >60)
Glucose, Bld: 114 mg/dL — ABNORMAL HIGH (ref 70–99)
Potassium: 3.6 mmol/L (ref 3.5–5.1)
Sodium: 140 mmol/L (ref 135–145)
Total Bilirubin: 0.3 mg/dL (ref 0.0–1.2)
Total Protein: 7.1 g/dL (ref 6.5–8.1)

## 2024-04-22 LAB — PRO BRAIN NATRIURETIC PEPTIDE: Pro Brain Natriuretic Peptide: 91.5 pg/mL (ref <300.0)

## 2024-04-22 LAB — HCG, SERUM, QUALITATIVE: Preg, Serum: NEGATIVE

## 2024-04-22 NOTE — ED Provider Notes (Signed)
 [place] EMERGENCY DEPARTMENT AT [facility] Provider Note   CSN: [id number] Arrival date & time: 04/22/24  1415     Patient presents with: Leg Swelling   Monique Hughes is a 43 y.o. female.   HPI   43 year old female presents emergency department with complaints of extremity swelling.  Symptoms present for the past month or so left greater than right.  Was seen in outpatient setting and had an elevated D-dimer and was sent for DVT study.  Patient denies any history of DVT/PE, recent surgery/immobilization, known coagulopathy, known malignancy, hormonal therapy.  Denies any shortness of breath, fever, chills.  Abdominal pain, nausea, vomiting.  States that the swelling tends to get better whenever she elevates her legs.  Past medical history significant for GERD, asthma, anxiety, anemia, endometriosis  Prior to Admission medications   Medication Sig Start Date End Date Taking? Authorizing Provider  hydrOXYzine (ATARAX) 25 MG tablet Take 25 mg by mouth daily. 03/12/24  Yes [provider]  meloxicam (MOBIC) 15 MG tablet Take 15 mg by mouth daily. 03/12/24  Yes [provider]  SINGULAIR 10 MG tablet Take 10 mg by mouth daily. 04/09/18  Yes [provider]  albuterol  (PROVENTIL  HFA;VENTOLIN  HFA) 108 (90 BASE) MCG/ACT inhaler Inhale 2 puffs into the lungs every 6 (six) hours as needed for wheezing or shortness of breath.     [provider]  ALPRAZolam (XANAX) 1 MG tablet Take 1 mg by mouth daily as needed for anxiety.     [provider]  Carboxymethylcellul-Glycerin (LUBRICATING EYE DROPS OP) Place 1 drop into both eyes daily as needed (dry eyes).    [provider]  Cyanocobalamin (B-12 PO) Take 1 tablet by mouth daily.    [provider]  cyclobenzaprine  (FLEXERIL ) 10 MG tablet Take 10 mg by mouth 3 (three) times daily as needed for muscle spasms.    [provider]  ELDERBERRY PO Take 1 capsule by mouth 4  (four) times a week.     [provider]  EPINEPHrine 0.3 mg/0.3 mL IJ SOAJ injection Inject 0.3 mg into the muscle as needed for anaphylaxis. 07/03/20   [provider]  ketorolac  (TORADOL ) 10 MG tablet Take 10 mg by mouth daily as needed for moderate pain.    [provider]  mirabegron ER (MYRBETRIQ) 50 MG TB24 tablet Take 50 mg by mouth daily as needed (overactive bladder).     [provider]  Multiple Vitamins-Minerals (MULTIVITAMIN WITH MINERALS) tablet Take 1 tablet by mouth daily.      [provider]  oxyCODONE -acetaminophen  (PERCOCET) 10-325 MG per tablet Take 1 tablet by mouth 3 (three) times daily as needed for pain.  06/14/15   [provider]  pantoprazole (PROTONIX) 40 MG tablet Take 40 mg by mouth daily.    [provider]  phenylephrine (SUDAFED PE) 10 MG TABS tablet Take 10 mg by mouth daily.    [provider]  promethazine (PHENERGAN) 25 MG tablet Take 12.5-25 mg by mouth every 8 (eight) hours as needed for nausea or vomiting.     [provider]  tolterodine (DETROL LA) 4 MG 24 hr capsule Take 4 mg by mouth daily.    [provider]  valACYclovir (VALTREX) 500 MG tablet Take 500 mg by mouth daily. 08/05/20   [provider]  Vitamin D, Cholecalciferol, 10 MCG (400 UNIT) CAPS Take 400 Units by mouth daily.    [provider]    Allergies:  Chocolate, Citric acid, Doxycycline, Grapefruit extract, Lemon juice, Orange fruit, Sulfa  antibiotics, Tomato, and Tramadol    Review of Systems  All other systems reviewed and are negative.   Updated Vital Signs BP (!) 140/99   Pulse 83   Temp 98 F (36.7 C)   Resp 16   Ht 5' 4 (1.626 m)   Wt 84.4 kg   LMP 04/17/2024 (Exact Date)   SpO2 100%   BMI 31.93 kg/m   Physical Exam Vitals and nursing note reviewed.  Constitutional:      General: She is not in acute distress.    Appearance: She is well-developed.  HENT:      Head: Normocephalic and atraumatic.   Eyes:     Conjunctiva/sclera: Conjunctivae normal.    Cardiovascular:     Rate and Rhythm: Normal rate and regular rhythm.     Heart sounds: No murmur heard. Pulmonary:     Effort: Pulmonary effort is normal. No respiratory distress.     Breath sounds: Normal breath sounds. No wheezing, rhonchi or rales.  Abdominal:     Palpations: Abdomen is soft.     Tenderness: There is no abdominal tenderness.   Musculoskeletal:        General: No swelling.     Cervical back: Neck supple.     Comments: No pitting edema lower extremities.  Pedal and posterior tibial pulses 2+ anticoagulation bilateral lower extremities.   Skin:    General: Skin is warm and dry.     Capillary Refill: Capillary refill takes less than 2 seconds.   Neurological:     Mental Status: She is alert.   Psychiatric:        Mood and Affect: Mood normal.    (all labs ordered are listed, but only abnormal results are displayed) Labs Reviewed  COMPREHENSIVE METABOLIC PANEL WITH GFR - Abnormal; Notable for the following components:      Result Value   Glucose, Bld 114 (*)    All other components within normal limits  CBC WITH DIFFERENTIAL/PLATELET - Abnormal; Notable for the following components:   WBC 12.3 (*)    RBC 3.73 (*)    Hemoglobin 11.9 (*)    HCT 35.4 (*)    Platelets 432 (*)    Neutro Abs 9.0 (*)    All other components within normal limits  HCG, SERUM, QUALITATIVE  PRO BRAIN NATRIURETIC PEPTIDE    EKG: None  Radiology: US  Venous Img Lower Bilateral Result Date: 04/22/2024 CLINICAL DATA:  Leg pain and swelling left greater than right for approximately 2-3 weeks. EXAM: Bilateral LOWER EXTREMITY VENOUS DOPPLER ULTRASOUND TECHNIQUE: Gray-scale sonography with compression, as well as color and duplex ultrasound, were performed to evaluate the deep venous system(s) from the level of the common femoral vein through the popliteal and proximal calf veins. COMPARISON:   None Available. FINDINGS: VENOUS Normal compressibility of the common femoral, superficial femoral, and popliteal veins, as well as the visualized calf veins. Visualized portions of profunda femoral vein and great saphenous vein unremarkable. No filling defects to suggest DVT on grayscale or color Doppler imaging. Doppler waveforms show normal direction of venous flow, normal respiratory plasticity and response to augmentation. Limited views of the contralateral common femoral vein are unremarkable. OTHER None. Limitations: none IMPRESSION: Negative for DVT bilateral lower extremities. Electronically Signed   By: Susan Ensign   On: 04/22/2024 15:13     Procedures   Medications Ordered in the ED - No data to display  Medical Decision Making Amount and/or Complexity of Data Reviewed Radiology: ordered.   This patient presents to the ED for concern of extremity swelling, this involves an extensive number of treatment options, and is a complaint that carries with it a high risk of complications and morbidity.  The differential diagnosis includes DVT, dependent edema, hypoalbuminemia, hypoproteinemia, other   Co morbidities that complicate the patient evaluation  See HPI   Additional history obtained:  Additional history obtained from EMR External records from outside source obtained and reviewed including hospital records   Lab Tests:  I Ordered, and personally interpreted labs.  The pertinent results include: Leukocytosis of 10.3.  Anemia hemoglobin 11.9.  Thrombocytosis of 432.  No transaminitis.  No transaminitis.  No renal dysfunction.  BNP normal.  Urine pregnancy negative   Imaging Studies ordered:  I ordered imaging studies including bilateral lower extremity DVT, chest x-ray I independently visualized and interpreted imaging which showed  DVT study: Negative Chest x-ray: No acute cardiopulmonary abnormality. I agree with the radiologist  interpretation   Cardiac Monitoring: / EKG:  The patient was maintained on a cardiac monitor.  I personally viewed and interpreted the cardiac monitored which showed an underlying rhythm of: sinus rhythm   Consultations Obtained:  N/a   Problem List / ED Course / Critical interventions / Medication management  Lower extremity swelling Reevaluation of the patient  showed that the patient stayed the same I have reviewed the patients home medicines and have made adjustments as needed   Social Determinants of Health:  See above   Test / Admission - Considered:  Lower extremity swelling Vitals signs significant for hyper tension blood pressure 140/99. Otherwise within normal range and stable throughout visit. Laboratory/imaging studies significant for: See above 43 year old female presents emergency department with complaints of extremity swelling.  Symptoms present for the past month or so left greater than right.  Was seen in outpatient setting and had an elevated D-dimer and was sent for DVT study.  Patient denies any history of DVT/PE, recent surgery/immobilization, known coagulopathy, known malignancy, hormonal therapy.  Denies any shortness of breath, fever, chills.  Abdominal pain, nausea, vomiting.  States that the swelling tends to get better whenever she elevates her legs. On exam, patient without pitting edema of lower extremities.  Lungs clear to auscultation bilaterally.  Workup today reassuring.  Labs with slight leukocytosis of 12.3, anemia hemoglobin 11.9, thrombocytosis of 432 otherwise, no acute abnormality.  DVT ultrasound was negative for DVT.  Chest x-ray showed no acute bony abnormality.  Patient with negative BNP without evidence of pulmonary vas congestion on chest x-ray imaging; low suspicion for CHF.  Suspect the patient's extremity swelling likely secondary to dependency.  Recommend elevation of legs above the level of heart, compression stockings, low-salt  restriction.  Recommend follow-up with primary care for reassessment of symptoms.  Treatment plan discussed with patient and she acknowledged understanding was agreeable to said plan.  Patient well-appearing, afebrile in no acute distress. Worrisome signs and symptoms were discussed with the patient, and the patient acknowledged understanding to return to the ED if noticed. Patient was stable upon discharge.       Final diagnoses:  None    ED Discharge Orders     None          Mascotte Butter, [place] 04/22/24 1903    Lind Repine, MD 04/22/24 2258

## 2024-04-22 NOTE — Discharge Instructions (Addendum)
 Your workup today was reassuring.  Ultrasound of your legs did not show evidence of blood clot in your leg.  Your chest x-ray and heart failure enzyme appeared normal.  I suspect that your lower extremity swelling is secondary to dependent edema.  Recommend compression stockings in patient setting and elevating your legs above the heart.  Recommend salt restricted diet.  Recommend follow-up with your primary care for reassessment.  Please do not hesitate to return to the emergency department if the worrisome signs and symptoms we discussed become apparent.

## 2024-04-22 NOTE — ED Triage Notes (Signed)
 Pt caox4, ambulatory NAD c/o bilat leg swelling 2 wks. Walk-in clinic recommended pt come to ED for DVT r/o. Pt denies SOB.

## 2024-04-22 NOTE — ED Provider Triage Note (Cosign Needed)
 Emergency Medicine Provider Triage Evaluation Note  Monique Hughes , a 43 y.o. female  was evaluated in triage.  Pt complains of BL leg swelling L >>R x 1 month elevated d-dimer, sent from [facility] walk in.  Review of Systems  Positive: Leg swelling Negative: sob  Physical Exam  BP (!) 129/90   Pulse 100   Temp 98 F (36.7 C)   Resp 18   LMP 04/17/2024 (Exact Date)   SpO2 100%  Gen:   Awake, no distress   Resp:  Normal effort  MSK:   Moves extremities without difficulty  Other:    Medical Decision Making  Medically screening exam initiated at 2:29 PM.  Appropriate orders placed.  Monique Hughes was informed that the remainder of the evaluation will be completed by another provider, this initial triage assessment does not replace that evaluation, and the importance of remaining in the ED until their evaluation is complete.     Tama Fails, PA-C 04/22/24 1431

## 2024-10-25 ENCOUNTER — Ambulatory Visit: Payer: Self-pay

## 2024-11-29 ENCOUNTER — Ambulatory Visit
Admission: EM | Admit: 2024-11-29 | Discharge: 2024-11-29 | Disposition: A | Discharge status: Home / Self Care | Attending: Family Medicine | Admitting: Family Medicine

## 2024-11-29 DIAGNOSIS — R1032 Left lower quadrant pain: Secondary | ICD-10-CM

## 2024-11-29 DIAGNOSIS — R102 Pelvic and perineal pain unspecified side: Secondary | ICD-10-CM | POA: Diagnosis not present

## 2024-11-29 DIAGNOSIS — R10A2 Flank pain, left side: Secondary | ICD-10-CM | POA: Diagnosis not present

## 2024-11-29 DIAGNOSIS — K5792 Diverticulitis of intestine, part unspecified, without perforation or abscess without bleeding: Secondary | ICD-10-CM

## 2024-11-29 LAB — POCT URINE DIPSTICK
Bilirubin, UA: NEGATIVE
Blood, UA: NEGATIVE
Glucose, UA: NEGATIVE mg/dL
Ketones, POC UA: NEGATIVE mg/dL
Leukocytes, UA: NEGATIVE
Nitrite, UA: NEGATIVE
Protein Ur, POC: NEGATIVE mg/dL
Spec Grav, UA: 1.015
Urobilinogen, UA: 0.2 U/dL
pH, UA: 5.5

## 2024-11-29 MED ORDER — FLUCONAZOLE 150 MG PO TABS: 150.0000 mg | ORAL_TABLET | ORAL | 0 refills | Status: AC

## 2024-11-29 MED ORDER — AMOXICILLIN-POT CLAVULANATE 875-125 MG PO TABS: 1.0000 | ORAL_TABLET | Freq: Two times a day (BID) | ORAL | 0 refills | Status: AC

## 2024-11-29 NOTE — ED Triage Notes (Signed)
 Pt c/o abd pain left side started yesterday-feels may be diverticulitis or an ovarian cyst-NAD-steady gait

## 2024-11-29 NOTE — ED Provider Notes (Signed)
 " [facility]  Note:  This document was prepared using Conservation officer, historic buildings and may include unintentional dictation errors.  MRN: [id number] DOB: 1981/03/25  Subjective:   Monique Hughes is a 44 y.o. female presenting for 1 day history of moderate to severe left lower quadrant, left lower flank pain, left pelvic pain. Has a history of diverticulitis, endometriosis. Denies fever, n/v, bloody stools, constipation, diarrhea, rashes, dysuria, urinary frequency, hematuria.    Current Outpatient Medications  Medication Instructions   albuterol  (PROVENTIL  HFA;VENTOLIN  HFA) 108 (90 BASE) MCG/ACT inhaler 2 puffs, Inhalation, Every 6 hours PRN   ALPRAZolam (XANAX) 1 mg, Oral, Daily PRN   Carboxymethylcellul-Glycerin (LUBRICATING EYE DROPS OP) 1 drop, Both Eyes, Daily PRN   Cyanocobalamin (B-12 PO) 1 tablet, Oral, Daily   cyclobenzaprine  (FLEXERIL ) 10 mg, Oral, 3 times daily PRN   ELDERBERRY PO 1 capsule, Oral, 4 times weekly   EPINEPHrine (EPI-PEN) 0.3 mg, Intramuscular, As needed   hydrOXYzine (ATARAX) 25 mg, Daily   ketorolac  (TORADOL ) 10 mg, Oral, Daily PRN   meloxicam (MOBIC) 15 mg, Daily   mirabegron ER (MYRBETRIQ) 50 mg, Oral, Daily PRN   Multiple Vitamins-Minerals (MULTIVITAMIN WITH MINERALS) tablet 1 tablet, Daily   oxyCODONE -acetaminophen  (PERCOCET) 10-325 MG per tablet 1 tablet, Oral, 3 times daily PRN   pantoprazole (PROTONIX) 40 mg, Daily   phenylephrine (SUDAFED PE) 10 mg, Oral, Daily   promethazine (PHENERGAN) 12.5-25 mg, Oral, Every 8 hours PRN   Singulair 10 mg, Daily   tolterodine (DETROL LA) 4 mg, Oral, Daily   valACYclovir (VALTREX) 500 mg, Oral, Daily   Vitamin D (Cholecalciferol) 400 Units, Oral, Daily    Allergies[1]  Past Medical History:  Diagnosis Date   Anemia    iron/diet   Anxiety    Asthma    triggered by allergies   Bronchitis    Endometriosis    GERD (gastroesophageal reflux disease)    Headache    last 8/21    Heart murmur    mild  7 years ago no problems with surgery   No pertinent past medical history    Pneumonia    2010   Reflux      Past Surgical History:  Procedure Laterality Date   ABLATION ON ENDOMETRIOSIS  2016   CERVIX LESION DESTRUCTION  2004   CYSTOSCOPY WITH HYDRODISTENSION AND BIOPSY N/A 08/22/2020   Procedure: CYSTOSCOPY/BIOPSY/HYDRODISTENSION;  Surgeon: Elisabeth Valli BIRCH, MD;  Location: [facility];  Service: Urology;  Laterality: N/A;  45 MINS   DIAGNOSTIC LAPAROSCOPY  2020   Myomectomy   laproscopy  2003   cyst removal from ovary   OOPHORECTOMY  2010   rt overy    Family History  Problem Relation Age of Onset   Heart disease Father    Diabetes Mother     Social History   Occupational History   Not on file  Tobacco Use   Smoking status: Never   Smokeless tobacco: Never  Vaping Use   Vaping status: Never Used  Substance and Sexual Activity   Alcohol use: No   Drug use: No   Sexual activity: Not Currently     ROS   Objective:   Vitals: BP 110/75 (BP Location: Right Arm)   Pulse 90   Temp 98.5 F (36.9 C) (Oral)   Resp 20   LMP 11/21/2024   SpO2 98%   Physical Exam Constitutional:      General: She is not in acute distress.  Appearance: Normal appearance. She is well-developed. She is not ill-appearing, toxic-appearing or diaphoretic.  HENT:     Head: Normocephalic and atraumatic.     Nose: Nose normal.     Mouth/Throat:     Mouth: Mucous membranes are moist.     Pharynx: Oropharynx is clear.  Eyes:     General: No scleral icterus.       Right eye: No discharge.        Left eye: No discharge.     Extraocular Movements: Extraocular movements intact.     Conjunctiva/sclera: Conjunctivae normal.  Cardiovascular:     Rate and Rhythm: Normal rate.  Pulmonary:     Effort: Pulmonary effort is normal.  Abdominal:     General: Bowel sounds are normal. There is no distension.     Palpations: Abdomen is soft. There is no mass.     Tenderness:  There is abdominal tenderness in the left lower quadrant. There is no right CVA tenderness, left CVA tenderness, guarding or rebound.  Skin:    General: Skin is warm and dry.  Neurological:     General: No focal deficit present.     Mental Status: She is alert and oriented to person, place, and time.  Psychiatric:        Mood and Affect: Mood normal.        Behavior: Behavior normal.        Thought Content: Thought content normal.        Judgment: Judgment normal.     Results for orders placed or performed during the hospital encounter of 11/29/24 (from the past 24 hours)  POCT URINE DIPSTICK     Status: Normal   Collection Time: 11/29/24  3:48 PM  Result Value Ref Range   Color, UA yellow yellow   Clarity, UA clear clear   Glucose, UA negative negative mg/dL   Bilirubin, UA negative negative   Ketones, POC UA negative negative mg/dL   Spec Grav, UA 8.984 8.989 - 1.025   Blood, UA negative negative   pH, UA 5.5 5.0 - 8.0   Protein Ur, POC negative negative mg/dL   Urobilinogen, UA 0.2 0.2 or 1.0 E.U./dL   Nitrite, UA Negative Negative   Leukocytes, UA Negative Negative    Assessment and Plan :   PDMP not reviewed this encounter.  1. Diverticulitis   2. Acute pelvic pain, female   3. Left flank pain   4. Abdominal pain, left lower quadrant      Suspect uncomplicated diverticulitis given hemodynamically stable vital signs. Start Augmentin , clear liquids. Patient will consider ER visit. Counseled patient on potential for adverse effects with medications prescribed/recommended today, ER and return-to-clinic precautions discussed, patient verbalized understanding.     [1]  Allergies Allergen Reactions   Chocolate Hives   Citric Acid Hives   Doxycycline     Headache, feels all out of sorts   Grapefruit Extract Hives   Lemon Juice Hives   Orange Fruit Hives   Sulfa  Antibiotics Nausea And Vomiting   Tomato Hives   Tramadol Rash    headache     Christopher Savannah,  PA-C 11/29/24 1609  "

## 2025-02-03 ENCOUNTER — Ambulatory Visit: Payer: Self-pay | Admitting: Internal Medicine
